# Patient Record
Sex: Male | Born: 1955 | Race: Black or African American | Hispanic: No | Marital: Single | Smoking: Never smoker
Health system: Southern US, Community
[De-identification: ages and names within clinical notes are randomized; demographics above are authoritative.]

## PROBLEM LIST (undated history)

## (undated) DIAGNOSIS — E785 Hyperlipidemia, unspecified: Secondary | ICD-10-CM

## (undated) DIAGNOSIS — S069XAA Unspecified intracranial injury with loss of consciousness status unknown, initial encounter: Secondary | ICD-10-CM

## (undated) DIAGNOSIS — U071 COVID-19: Secondary | ICD-10-CM

## (undated) DIAGNOSIS — S069X9A Unspecified intracranial injury with loss of consciousness of unspecified duration, initial encounter: Secondary | ICD-10-CM

---

## 1999-09-17 ENCOUNTER — Encounter: Payer: Self-pay | Admitting: Family Medicine

## 1999-09-17 ENCOUNTER — Encounter: Admission: RE | Admit: 1999-09-17 | Discharge: 1999-09-17 | Payer: Self-pay | Admitting: Family Medicine

## 2000-03-08 ENCOUNTER — Encounter: Payer: Self-pay | Admitting: Emergency Medicine

## 2000-03-08 ENCOUNTER — Inpatient Hospital Stay (HOSPITAL_COMMUNITY): Admission: EM | Admit: 2000-03-08 | Discharge: 2000-03-10 | Payer: Self-pay | Admitting: Emergency Medicine

## 2011-03-29 DIAGNOSIS — R635 Abnormal weight gain: Secondary | ICD-10-CM | POA: Diagnosis not present

## 2011-03-29 DIAGNOSIS — Z125 Encounter for screening for malignant neoplasm of prostate: Secondary | ICD-10-CM | POA: Diagnosis not present

## 2012-09-11 DIAGNOSIS — Z125 Encounter for screening for malignant neoplasm of prostate: Secondary | ICD-10-CM | POA: Diagnosis not present

## 2012-09-11 DIAGNOSIS — E785 Hyperlipidemia, unspecified: Secondary | ICD-10-CM | POA: Diagnosis not present

## 2012-09-11 DIAGNOSIS — G931 Anoxic brain damage, not elsewhere classified: Secondary | ICD-10-CM | POA: Diagnosis not present

## 2012-09-11 DIAGNOSIS — Z1211 Encounter for screening for malignant neoplasm of colon: Secondary | ICD-10-CM | POA: Diagnosis not present

## 2013-04-05 DIAGNOSIS — E785 Hyperlipidemia, unspecified: Secondary | ICD-10-CM | POA: Diagnosis not present

## 2013-07-03 DIAGNOSIS — E785 Hyperlipidemia, unspecified: Secondary | ICD-10-CM | POA: Diagnosis not present

## 2013-08-19 DIAGNOSIS — Z79899 Other long term (current) drug therapy: Secondary | ICD-10-CM | POA: Diagnosis not present

## 2013-08-19 DIAGNOSIS — E785 Hyperlipidemia, unspecified: Secondary | ICD-10-CM | POA: Diagnosis not present

## 2013-10-03 DIAGNOSIS — E785 Hyperlipidemia, unspecified: Secondary | ICD-10-CM | POA: Diagnosis not present

## 2013-10-03 DIAGNOSIS — Z79899 Other long term (current) drug therapy: Secondary | ICD-10-CM | POA: Diagnosis not present

## 2014-03-20 ENCOUNTER — Ambulatory Visit (HOSPITAL_COMMUNITY)
Admission: RE | Admit: 2014-03-20 | Discharge: 2014-03-20 | Disposition: A | Discharge status: Home / Self Care | Payer: Medicare Other | Source: Ambulatory Visit | Attending: Family Medicine | Admitting: Family Medicine

## 2014-03-20 ENCOUNTER — Other Ambulatory Visit (HOSPITAL_COMMUNITY): Payer: Self-pay | Admitting: Family Medicine

## 2014-03-20 DIAGNOSIS — M7989 Other specified soft tissue disorders: Secondary | ICD-10-CM

## 2014-03-20 DIAGNOSIS — M79609 Pain in unspecified limb: Secondary | ICD-10-CM | POA: Diagnosis not present

## 2014-03-20 DIAGNOSIS — Z23 Encounter for immunization: Secondary | ICD-10-CM | POA: Diagnosis not present

## 2014-03-20 NOTE — Progress Notes (Signed)
VASCULAR LAB PRELIMINARY  PRELIMINARY  PRELIMINARY  PRELIMINARY  Right lower extremity venous duplex completed.    Preliminary report:  Right:  No evidence of DVT, superficial thrombosis, or Baker's cyst.  Yavonne Kiss, RVT 03/20/2014, 5:20 PM

## 2014-12-11 DIAGNOSIS — Z23 Encounter for immunization: Secondary | ICD-10-CM | POA: Diagnosis not present

## 2015-10-01 DIAGNOSIS — G931 Anoxic brain damage, not elsewhere classified: Secondary | ICD-10-CM | POA: Diagnosis not present

## 2015-10-01 DIAGNOSIS — Z0000 Encounter for general adult medical examination without abnormal findings: Secondary | ICD-10-CM | POA: Diagnosis not present

## 2015-10-01 DIAGNOSIS — E785 Hyperlipidemia, unspecified: Secondary | ICD-10-CM | POA: Diagnosis not present

## 2015-10-01 DIAGNOSIS — Z1211 Encounter for screening for malignant neoplasm of colon: Secondary | ICD-10-CM | POA: Diagnosis not present

## 2015-10-01 DIAGNOSIS — Z125 Encounter for screening for malignant neoplasm of prostate: Secondary | ICD-10-CM | POA: Diagnosis not present

## 2015-10-21 DIAGNOSIS — Z1211 Encounter for screening for malignant neoplasm of colon: Secondary | ICD-10-CM | POA: Diagnosis not present

## 2016-01-01 DIAGNOSIS — E785 Hyperlipidemia, unspecified: Secondary | ICD-10-CM | POA: Diagnosis not present

## 2016-04-06 DIAGNOSIS — K625 Hemorrhage of anus and rectum: Secondary | ICD-10-CM | POA: Diagnosis not present

## 2017-02-24 DIAGNOSIS — E785 Hyperlipidemia, unspecified: Secondary | ICD-10-CM | POA: Diagnosis not present

## 2017-02-24 DIAGNOSIS — Z125 Encounter for screening for malignant neoplasm of prostate: Secondary | ICD-10-CM | POA: Diagnosis not present

## 2017-06-19 DIAGNOSIS — E785 Hyperlipidemia, unspecified: Secondary | ICD-10-CM | POA: Diagnosis not present

## 2017-06-19 DIAGNOSIS — Z79899 Other long term (current) drug therapy: Secondary | ICD-10-CM | POA: Diagnosis not present

## 2017-06-19 DIAGNOSIS — Z23 Encounter for immunization: Secondary | ICD-10-CM | POA: Diagnosis not present

## 2017-06-19 DIAGNOSIS — Z1211 Encounter for screening for malignant neoplasm of colon: Secondary | ICD-10-CM | POA: Diagnosis not present

## 2017-06-19 DIAGNOSIS — Z0000 Encounter for general adult medical examination without abnormal findings: Secondary | ICD-10-CM | POA: Diagnosis not present

## 2017-06-19 DIAGNOSIS — G931 Anoxic brain damage, not elsewhere classified: Secondary | ICD-10-CM | POA: Diagnosis not present

## 2018-12-12 DIAGNOSIS — Z125 Encounter for screening for malignant neoplasm of prostate: Secondary | ICD-10-CM | POA: Diagnosis not present

## 2018-12-12 DIAGNOSIS — Z23 Encounter for immunization: Secondary | ICD-10-CM | POA: Diagnosis not present

## 2018-12-12 DIAGNOSIS — Z0000 Encounter for general adult medical examination without abnormal findings: Secondary | ICD-10-CM | POA: Diagnosis not present

## 2018-12-12 DIAGNOSIS — G931 Anoxic brain damage, not elsewhere classified: Secondary | ICD-10-CM | POA: Diagnosis not present

## 2018-12-12 DIAGNOSIS — Z1211 Encounter for screening for malignant neoplasm of colon: Secondary | ICD-10-CM | POA: Diagnosis not present

## 2018-12-12 DIAGNOSIS — E785 Hyperlipidemia, unspecified: Secondary | ICD-10-CM | POA: Diagnosis not present

## 2019-01-14 DIAGNOSIS — U071 COVID-19: Secondary | ICD-10-CM | POA: Diagnosis not present

## 2019-01-27 ENCOUNTER — Other Ambulatory Visit: Payer: Self-pay

## 2019-01-27 ENCOUNTER — Emergency Department (HOSPITAL_COMMUNITY): Payer: Medicare HMO

## 2019-01-27 ENCOUNTER — Inpatient Hospital Stay (HOSPITAL_COMMUNITY)
Admission: EM | Admit: 2019-01-27 | Discharge: 2019-01-30 | DRG: 872 | Disposition: A | Discharge status: Home / Self Care | Payer: Medicare HMO | Attending: Internal Medicine | Admitting: Internal Medicine

## 2019-01-27 DIAGNOSIS — R748 Abnormal levels of other serum enzymes: Secondary | ICD-10-CM | POA: Diagnosis not present

## 2019-01-27 DIAGNOSIS — N179 Acute kidney failure, unspecified: Secondary | ICD-10-CM | POA: Diagnosis present

## 2019-01-27 DIAGNOSIS — E876 Hypokalemia: Secondary | ICD-10-CM | POA: Diagnosis not present

## 2019-01-27 DIAGNOSIS — Z806 Family history of leukemia: Secondary | ICD-10-CM | POA: Diagnosis not present

## 2019-01-27 DIAGNOSIS — Z8619 Personal history of other infectious and parasitic diseases: Secondary | ICD-10-CM | POA: Diagnosis not present

## 2019-01-27 DIAGNOSIS — A419 Sepsis, unspecified organism: Secondary | ICD-10-CM | POA: Diagnosis present

## 2019-01-27 DIAGNOSIS — Z79899 Other long term (current) drug therapy: Secondary | ICD-10-CM | POA: Diagnosis not present

## 2019-01-27 DIAGNOSIS — N3001 Acute cystitis with hematuria: Secondary | ICD-10-CM | POA: Diagnosis not present

## 2019-01-27 DIAGNOSIS — Z791 Long term (current) use of non-steroidal anti-inflammatories (NSAID): Secondary | ICD-10-CM

## 2019-01-27 DIAGNOSIS — R17 Unspecified jaundice: Secondary | ICD-10-CM | POA: Diagnosis not present

## 2019-01-27 DIAGNOSIS — N400 Enlarged prostate without lower urinary tract symptoms: Secondary | ICD-10-CM | POA: Diagnosis present

## 2019-01-27 DIAGNOSIS — R310 Gross hematuria: Secondary | ICD-10-CM | POA: Diagnosis present

## 2019-01-27 DIAGNOSIS — D62 Acute posthemorrhagic anemia: Secondary | ICD-10-CM | POA: Diagnosis not present

## 2019-01-27 DIAGNOSIS — R404 Transient alteration of awareness: Secondary | ICD-10-CM | POA: Diagnosis not present

## 2019-01-27 DIAGNOSIS — N390 Urinary tract infection, site not specified: Secondary | ICD-10-CM | POA: Diagnosis not present

## 2019-01-27 DIAGNOSIS — I341 Nonrheumatic mitral (valve) prolapse: Secondary | ICD-10-CM | POA: Diagnosis not present

## 2019-01-27 DIAGNOSIS — I700 Atherosclerosis of aorta: Secondary | ICD-10-CM | POA: Diagnosis not present

## 2019-01-27 DIAGNOSIS — U071 COVID-19: Secondary | ICD-10-CM | POA: Diagnosis present

## 2019-01-27 DIAGNOSIS — R410 Disorientation, unspecified: Secondary | ICD-10-CM | POA: Diagnosis not present

## 2019-01-27 DIAGNOSIS — E785 Hyperlipidemia, unspecified: Secondary | ICD-10-CM | POA: Diagnosis present

## 2019-01-27 DIAGNOSIS — R7989 Other specified abnormal findings of blood chemistry: Secondary | ICD-10-CM | POA: Diagnosis not present

## 2019-01-27 DIAGNOSIS — Z87820 Personal history of traumatic brain injury: Secondary | ICD-10-CM | POA: Diagnosis not present

## 2019-01-27 DIAGNOSIS — R000 Tachycardia, unspecified: Secondary | ICD-10-CM | POA: Diagnosis not present

## 2019-01-27 DIAGNOSIS — N3289 Other specified disorders of bladder: Secondary | ICD-10-CM | POA: Diagnosis not present

## 2019-01-27 HISTORY — DX: Unspecified intracranial injury with loss of consciousness of unspecified duration, initial encounter: S06.9X9A

## 2019-01-27 HISTORY — DX: Hyperlipidemia, unspecified: E78.5

## 2019-01-27 HISTORY — DX: COVID-19: U07.1

## 2019-01-27 HISTORY — DX: Unspecified intracranial injury with loss of consciousness status unknown, initial encounter: S06.9XAA

## 2019-01-27 LAB — HEPATIC FUNCTION PANEL
ALT: 23 U/L (ref 0–44)
AST: 21 U/L (ref 15–41)
Albumin: 3.1 g/dL — ABNORMAL LOW (ref 3.5–5.0)
Alkaline Phosphatase: 106 U/L (ref 38–126)
Bilirubin, Direct: 0.5 mg/dL — ABNORMAL HIGH (ref 0.0–0.2)
Indirect Bilirubin: 2.4 mg/dL — ABNORMAL HIGH (ref 0.3–0.9)
Total Bilirubin: 2.9 mg/dL — ABNORMAL HIGH (ref 0.3–1.2)
Total Protein: 8 g/dL (ref 6.5–8.1)

## 2019-01-27 LAB — URINALYSIS, ROUTINE W REFLEX MICROSCOPIC
Bilirubin Urine: NEGATIVE
Glucose, UA: NEGATIVE mg/dL
Ketones, ur: 20 mg/dL — AB
Nitrite: NEGATIVE
Protein, ur: 100 mg/dL — AB
Specific Gravity, Urine: >1.046 — ABNORMAL HIGH (ref 1.005–1.030)
pH: 5 (ref 5.0–8.0)

## 2019-01-27 LAB — CBC
HCT: 43 % (ref 39.0–52.0)
Hemoglobin: 14 g/dL (ref 13.0–17.0)
MCH: 29.2 pg (ref 26.0–34.0)
MCHC: 32.6 g/dL (ref 30.0–36.0)
MCV: 89.6 fL (ref 80.0–100.0)
Platelets: 285 10*3/uL (ref 150–400)
RBC: 4.8 MIL/uL (ref 4.22–5.81)
RDW: 13.4 % (ref 11.5–15.5)
WBC: 19.8 10*3/uL — ABNORMAL HIGH (ref 4.0–10.5)
nRBC: 0 % (ref 0.0–0.2)

## 2019-01-27 LAB — LACTIC ACID, PLASMA
Lactic Acid, Venous: 4 mmol/L (ref 0.5–1.9)
Lactic Acid, Venous: 1.8 mmol/L (ref 0.5–1.9)

## 2019-01-27 LAB — BASIC METABOLIC PANEL
Anion gap: 13 (ref 5–15)
BUN: 15 mg/dL (ref 8–23)
CO2: 27 mmol/L (ref 22–32)
Calcium: 9.7 mg/dL (ref 8.9–10.3)
Chloride: 98 mmol/L (ref 98–111)
Creatinine, Ser: 1.25 mg/dL — ABNORMAL HIGH (ref 0.61–1.24)
GFR calc Af Amer: >60 mL/min (ref >60)
GFR calc non Af Amer: >60 mL/min (ref >60)
Glucose, Bld: 119 mg/dL — ABNORMAL HIGH (ref 70–99)
Potassium: 3.9 mmol/L (ref 3.5–5.1)
Sodium: 138 mmol/L (ref 135–145)

## 2019-01-27 LAB — TROPONIN I (HIGH SENSITIVITY): Troponin I (High Sensitivity): 48 ng/L — ABNORMAL HIGH (ref <18)

## 2019-01-27 LAB — POC OCCULT BLOOD, ED: Fecal Occult Bld: NEGATIVE

## 2019-01-27 LAB — APTT: aPTT: 24 seconds (ref 24–36)

## 2019-01-27 LAB — PROTIME-INR
INR: 1.2 (ref 0.8–1.2)
Prothrombin Time: 15.2 seconds (ref 11.4–15.2)

## 2019-01-27 MED ORDER — SODIUM CHLORIDE 0.9 % IV SOLN
2.0000 g | Freq: Three times a day (TID) | INTRAVENOUS | Status: DC
  Administered 2019-01-28 – 2019-01-29 (×5): 2 g via INTRAVENOUS
  Filled 2019-01-27 (×7): qty 2

## 2019-01-27 MED ORDER — ATORVASTATIN CALCIUM 40 MG PO TABS
40.0000 mg | ORAL_TABLET | Freq: Every day | ORAL | Status: DC
  Administered 2019-01-28 – 2019-01-29 (×2): 40 mg via ORAL
  Filled 2019-01-27 (×2): qty 1

## 2019-01-27 MED ORDER — SODIUM CHLORIDE 0.9 % IV SOLN
2.0000 g | Freq: Once | INTRAVENOUS | Status: AC
  Administered 2019-01-27: 2 g via INTRAVENOUS
  Filled 2019-01-27: qty 2

## 2019-01-27 MED ORDER — SODIUM CHLORIDE 0.9 % IV BOLUS (SEPSIS)
1000.0000 mL | Freq: Once | INTRAVENOUS | Status: AC
  Administered 2019-01-27: 1000 mL via INTRAVENOUS

## 2019-01-27 MED ORDER — IOHEXOL 300 MG/ML  SOLN
100.0000 mL | Freq: Once | INTRAMUSCULAR | Status: AC | PRN
  Administered 2019-01-27: 100 mL via INTRAVENOUS

## 2019-01-27 MED ORDER — ACETAMINOPHEN 325 MG PO TABS
650.0000 mg | ORAL_TABLET | Freq: Four times a day (QID) | ORAL | Status: DC | PRN
  Administered 2019-01-27 – 2019-01-28 (×2): 650 mg via ORAL
  Filled 2019-01-27 (×3): qty 2

## 2019-01-27 MED ORDER — METRONIDAZOLE IN NACL 5-0.79 MG/ML-% IV SOLN
500.0000 mg | Freq: Once | INTRAVENOUS | Status: AC
  Administered 2019-01-27: 21:00:00 500 mg via INTRAVENOUS
  Filled 2019-01-27: qty 100

## 2019-01-27 MED ORDER — VANCOMYCIN HCL 10 G IV SOLR
1750.0000 mg | Freq: Once | INTRAVENOUS | Status: DC
  Filled 2019-01-27: qty 1750

## 2019-01-27 MED ORDER — SODIUM CHLORIDE 0.9 % IV BOLUS (SEPSIS)
250.0000 mL | Freq: Once | INTRAVENOUS | Status: AC
  Administered 2019-01-27: 250 mL via INTRAVENOUS

## 2019-01-27 MED ORDER — ACETAMINOPHEN 650 MG RE SUPP: 650.0000 mg | Freq: Four times a day (QID) | RECTAL | Status: DC | PRN

## 2019-01-27 MED ORDER — VANCOMYCIN HCL 10 G IV SOLR
2000.0000 mg | Freq: Once | INTRAVENOUS | Status: DC
  Administered 2019-01-27: 22:00:00 2000 mg via INTRAVENOUS
  Filled 2019-01-27: qty 2000

## 2019-01-27 MED ORDER — VANCOMYCIN HCL 10 G IV SOLR
1500.0000 mg | INTRAVENOUS | Status: DC
  Administered 2019-01-28: 1500 mg via INTRAVENOUS
  Filled 2019-01-27 (×2): qty 1500

## 2019-01-27 MED ORDER — VANCOMYCIN HCL IN DEXTROSE 1-5 GM/200ML-% IV SOLN: 1000.0000 mg | Freq: Once | INTRAVENOUS | Status: DC

## 2019-01-27 NOTE — ED Notes (Signed)
Pt to CT

## 2019-01-27 NOTE — ED Notes (Signed)
Attempted to straight cath patient, patient was unable to cooperate. No urine return with straight cath

## 2019-01-27 NOTE — ED Triage Notes (Signed)
Pt bib ems from home after wife noticed blood in brief. Hx of same, today was more than normal. Hx TBI. Pt not able to provide hx. Tested +covid approx 2 weeks ago. Vitals hard to obtain.  100F HR 120  136/80 97% RA

## 2019-01-27 NOTE — ED Notes (Signed)
Bruce Gonzales [phone] relative

## 2019-01-27 NOTE — Progress Notes (Signed)
Pharmacy Antibiotic Note  Bruce Gonzales is a 63 y.o. male admitted on 01/27/2019 with sepsis.  Pharmacy has been consulted for Cefepime and Vancomycin dosing.  Weight: 162 lb 11.2 oz (73.8 kg)  Temp (24hrs), Avg:99.8 F (37.7 C), Min:98.5 F (36.9 C), Max:101 F (38.3 C)  Recent Labs  Lab 01/27/19 1544 01/27/19 1904  WBC 19.8*  --   CREATININE 1.25*  --   LATICACIDVEN  --  4.0*    CrCl cannot be calculated (Unknown ideal weight.).    No Known Allergies  Antimicrobials this admission: 11/29 Cefepime >>  11/29 Flagyl >>  11/29 Vancomycin >>   Dose adjustments this admission: N/a  Microbiology results: 11/29 BCx: Pending  11/29 UCx: Pending   Plan: - Cefepime 2 grams IV q8h  - Vancomycin 1750 mg IV x 1 dose  - Followed by Vancomycin 1500 mg IV q24h  - Est calc AUC 508 - Monitor patients renal function and opportunity to deescalate abx when appropriate   Thank you for allowing pharmacy to be a part of this patient's care.  Duanne Limerick PharmD. BCPS  01/27/2019 8:33 PM
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
## 2019-01-27 NOTE — H&P (Addendum)
TRH H&P    Patient Demographics:    Bruce Gonzales, is a 63 y.o. male  MRN: [id number]  DOB - 1955-12-15  Admit Date - 01/27/2019  Referring MD/NP/PA:  Benedetto Goad  Outpatient Primary MD for the patient is Harlan Stains, MD  Patient coming from:  home  Chief complaint-   hematuria   HPI:    Bruce Gonzales  is a 63 y.o. male,  w TBI , Covid 19 positive about 2 weeks ago, presents w blood in diaper.  Family denies fever, chills, cough, cp, palp, sob, n/v, diarrhea, brbpr.  Family is not sure if having dysuria.  Pt was sent for gross hematuria.    In ED,  T 98.5, P 100 R 20, Bp 130/89,  poox 96% on RA  Wt 73.8kg T 101  Wbc 19.8, Hgb 14.0, Plt 285 Na 138, K 3.9 Bun 15, Creatinine 1.25 Ast 21, Alt 23, Alk phos 106, T. Bili 2.9 Alb 3.1  Lactic acid 4.0-> 1.8 Blood culture x2   CXR IMPRESSION: No active disease.  CT abd/ pelvis IMPRESSION: Thickened bladder wall. This could be related to bladder outlet obstruction with the enlarged prostate. This could also represent cystitis. Recommend clinical correlation.  Urinalysis wbc 11-20, rbc 11-20  Pt will be admitted for sepsis (fever, elevated wbc, elevated lactic acid),  Secondary to UTI.     Review of systems:    In addition to the HPI above, pt is unable to provide history No Fever-chills, No Headache, No changes with Vision or hearing, No problems swallowing food or Liquids, No Chest pain, Cough or Shortness of Breath, No Abdominal pain, No Nausea or Vomiting, bowel movements are regular, No Blood in stool or Urine, No dysuria, No new skin rashes or bruises, No new joints pains-aches,  No new weakness, tingling, numbness in any extremity, No recent weight gain or loss, No polyuria, polydypsia or polyphagia, No significant Mental Stressors.  All other systems reviewed and are negative.    Past History of the following :    Past  Medical History:  Diagnosis Date   COVID-19    Hyperlipidemia    TBI (traumatic brain injury) ([place])       History reviewed. No pertinent surgical history. None per sister   Social History:      Social History   Tobacco Use   Smoking status: Never Smoker   Smokeless tobacco: Never Used  Substance Use Topics   Alcohol use: Not Currently       Family History :     Family History  Problem Relation Age of Onset   Leukemia Mother       Home Medications:   Prior to Admission medications   Medication Sig Start Date End Date Taking? Authorizing Provider  atorvastatin (LIPITOR) 40 MG tablet Take 40 mg by mouth at bedtime.  09/26/18  Yes [provider]  Cholecalciferol (VITAMIN D-3) 125 MCG (5000 UT) TABS Take 5,000 Units by mouth daily.   Yes [provider]  Multiple Vitamin (MULTIVITAMIN WITH MINERALS) TABS  tablet Take 1 tablet by mouth daily.   Yes [provider]  naproxen sodium (ALEVE) 220 MG tablet Take 440 mg by mouth 2 (two) times daily as needed (pain).   Yes [provider]     Allergies:    No Known Allergies   Physical Exam:   Vitals  Blood pressure 126/84, pulse (!) 119, temperature (!) 100.4 F (38 C), temperature source Oral, resp. rate 18, weight 73.8 kg, SpO2 97 %.  1.  General: axoxo1  2. Psychiatric: euthymic  3. Neurologic: nonfocal  4. HEENMT:  Anicteric, pupils 1.15m symmetric, direct, consensual, near intact Neck: no jvd  5. Respiratory : CTAB  6. Cardiovascular : rrr s1, s2,   7. Gastrointestinal:  Abd: soft, nt, nd, +bs  8. Skin:  Ext: no c/c/e, no rash  9.Musculoskeletal:  Good ROM    Data Review:    CBC Recent Labs  Lab 01/27/19 1544  WBC 19.8*  HGB 14.0  HCT 43.0  PLT 285  MCV 89.6  MCH 29.2  MCHC 32.6  RDW 13.4   ------------------------------------------------------------------------------------------------------------------  Results for orders placed or  performed during the hospital encounter of 01/27/19 (from the past 48 hour(s))  Basic metabolic panel     Status: Abnormal   Collection Time: 01/27/19  3:44 PM  Result Value Ref Range   Sodium 138 135 - 145 mmol/L   Potassium 3.9 3.5 - 5.1 mmol/L   Chloride 98 98 - 111 mmol/L   CO2 27 22 - 32 mmol/L   Glucose, Bld 119 (H) 70 - 99 mg/dL   BUN 15 8 - 23 mg/dL   Creatinine, Ser 1.25 (H) 0.61 - 1.24 mg/dL   Calcium 9.7 8.9 - 10.3 mg/dL   GFR calc non Af Amer >60 >60 mL/min   GFR calc Af Amer >60 >60 mL/min   Anion gap 13 5 - 15    Comment: Performed at [facility] [address], [id number] CBC     Status: Abnormal   Collection Time: 01/27/19  3:44 PM  Result Value Ref Range   WBC 19.8 (H) 4.0 - 10.5 K/uL   RBC 4.80 4.22 - 5.81 MIL/uL   Hemoglobin 14.0 13.0 - 17.0 g/dL   HCT 43.0 39.0 - 52.0 %   MCV 89.6 80.0 - 100.0 fL   MCH 29.2 26.0 - 34.0 pg   MCHC 32.6 30.0 - 36.0 g/dL   RDW 13.4 11.5 - 15.5 %   Platelets 285 150 - 400 K/uL   nRBC 0.0 0.0 - 0.2 %    Comment: Performed at [facility] [address], [place] [address] POC occult blood, ED     Status: None   Collection Time: 01/27/19  5:56 PM  Result Value Ref Range   Fecal Occult Bld NEGATIVE NEGATIVE  Lactic acid, plasma     Status: Abnormal   Collection Time: 01/27/19  7:04 PM  Result Value Ref Range   Lactic Acid, Venous 4.0 (HH) 0.5 - 1.9 mmol/L    Comment: CRITICAL RESULT CALLED TO, READ BACK BY AND VERIFIED WITH: RN A WILSON AT 1936 01/27/2019 BY L BENFIELD Performed at [facility] [address] [place] [address]  APTT     Status: None   Collection Time: 01/27/19  7:04 PM  Result Value Ref Range   aPTT 24 24 - 36 seconds    Comment: Performed at [facility], [address], [place], [place] [address]  Protime-INR     Status: None   Collection Time: 01/27/19  7:04 PM  Result Value Ref Range   Prothrombin Time 15.2 11.4 - 15.2 seconds   INR 1.2  0.8 - 1.2    Comment: (NOTE) INR goal varies based on device and disease states. Performed at [facility], [place] [address] [place], [place] [address]   Hepatic function panel     Status: Abnormal   Collection Time: 01/27/19  7:04 PM  Result Value Ref Range   Total Protein 8.0 6.5 - 8.1 g/dL   Albumin 3.1 (L) 3.5 - 5.0 g/dL   AST 21 15 - 41 U/L   ALT 23 0 - 44 U/L   Alkaline Phosphatase 106 38 - 126 U/L   Total Bilirubin 2.9 (H) 0.3 - 1.2 mg/dL   Bilirubin, Direct 0.5 (H) 0.0 - 0.2 mg/dL   Indirect Bilirubin 2.4 (H) 0.3 - 0.9 mg/dL    Comment: Performed at [facility] [address], [place], [place] [address]  Urinalysis, Routine w reflex microscopic- may I&O cath if menses     Status: Abnormal   Collection Time: 01/27/19  8:35 PM  Result Value Ref Range   Color, Urine AMBER (A) YELLOW    Comment: BIOCHEMICALS MAY BE AFFECTED BY COLOR   APPearance CLOUDY (A) CLEAR   Specific Gravity, Urine >1.046 (H) 1.005 - 1.030   pH 5.0 5.0 - 8.0   Glucose, UA NEGATIVE NEGATIVE mg/dL   Hgb urine dipstick LARGE (A) NEGATIVE   Bilirubin Urine NEGATIVE NEGATIVE   Ketones, ur 20 (A) NEGATIVE mg/dL   Protein, ur 100 (A) NEGATIVE mg/dL   Nitrite NEGATIVE NEGATIVE   Leukocytes,Ua SMALL (A) NEGATIVE   RBC / HPF 11-20 0 - 5 RBC/hpf   WBC, UA 11-20 0 - 5 WBC/hpf   Bacteria, UA FEW (A) NONE SEEN   WBC Clumps PRESENT    Mucus PRESENT    Amorphous Crystal PRESENT     Comment: Performed at [facility], [address] [place], [place] [address]  Lactic acid, plasma     Status: None   Collection Time: 01/27/19 10:35 PM  Result Value Ref Range   Lactic Acid, Venous 1.8 0.5 - 1.9 mmol/L    Comment: Performed at [facility] [address], [place], [place] [address]  Troponin I (High Sensitivity)     Status: Abnormal   Collection Time: 01/27/19 10:35 PM  Result Value Ref Range   Troponin I (High Sensitivity) 48 (H) <18 ng/L    Comment: (NOTE) Elevated high sensitivity  troponin I (hsTnI) values and significant  changes across serial measurements may suggest ACS but many other  chronic and acute conditions are known to elevate hsTnI results.  Refer to the "Links" section for chest pain algorithms and additional  guidance. Performed at [facility], [place] [address], [place],  [address]     Chemistries  Recent Labs  Lab 01/27/19 1544 01/27/19 1904  NA 138  --   K 3.9  --   CL 98  --   CO2 27  --   GLUCOSE 119*  --   BUN 15  --   CREATININE 1.25*  --   CALCIUM 9.7  --   AST  --  21  ALT  --  23  ALKPHOS  --  106  BILITOT  --  2.9*   ------------------------------------------------------------------------------------------------------------------  ------------------------------------------------------------------------------------------------------------------  GFR: CrCl cannot be calculated (Unknown ideal weight.). Liver Function Tests: Recent Labs  Lab 01/27/19 1904  AST 21  ALT 23  ALKPHOS 106  BILITOT 2.9*  PROT 8.0  ALBUMIN 3.1*   No results for input(s): LIPASE, AMYLASE in the last 168 hours. No results for input(s): AMMONIA in the last 168 hours. Coagulation Profile: Recent Labs  Lab 01/27/19 1904  INR 1.2   Cardiac Enzymes: No results for input(s): CKTOTAL, CKMB, CKMBINDEX, TROPONINI in the last 168 hours. BNP (last 3 results) No results for input(s): PROBNP in the last 8760 hours. HbA1C: No results for input(s): HGBA1C in the last 72 hours. CBG: No results for input(s): GLUCAP in the last 168 hours. Lipid Profile: No results for input(s): CHOL, HDL, LDLCALC, TRIG, CHOLHDL, LDLDIRECT in the last 72 hours. Thyroid Function Tests: No results for input(s): TSH, T4TOTAL, FREET4, T3FREE, THYROIDAB in the last 72 hours. Anemia Panel: No results for input(s): VITAMINB12, FOLATE, FERRITIN, TIBC, IRON, RETICCTPCT in the last 72  hours.  --------------------------------------------------------------------------------------------------------------- Urine analysis:    Component Value Date/Time   COLORURINE AMBER (A) 01/27/2019 2035   APPEARANCEUR CLOUDY (A) 01/27/2019 2035   LABSPEC >1.046 (H) 01/27/2019 2035   PHURINE 5.0 01/27/2019 2035   GLUCOSEU NEGATIVE 01/27/2019 2035   HGBUR LARGE (A) 01/27/2019 2035   BILIRUBINUR NEGATIVE 01/27/2019 2035   KETONESUR 20 (A) 01/27/2019 2035   PROTEINUR 100 (A) 01/27/2019 2035   NITRITE NEGATIVE 01/27/2019 2035   LEUKOCYTESUR SMALL (A) 01/27/2019 2035      Imaging Results:    Ct Abdomen Pelvis W Contrast  Result Date: 01/27/2019 CLINICAL DATA:  Abdominal distention. EXAM: CT ABDOMEN AND PELVIS WITH CONTRAST TECHNIQUE: Multidetector CT imaging of the abdomen and pelvis was performed using the standard protocol following bolus administration of intravenous contrast. CONTRAST:  148m OMNIPAQUE IOHEXOL 300 MG/ML  SOLN COMPARISON:  None. FINDINGS: Lower chest: Lung bases are clear. No effusions. Heart is normal size. Hepatobiliary: No focal hepatic abnormality. Gallbladder unremarkable. Pancreas: No focal abnormality or ductal dilatation. Spleen: No focal abnormality.  Normal size. Adrenals/Urinary Tract: No hydronephrosis. No renal or adrenal mass. Urinary bladder appears thick walled, decompressed. Stomach/Bowel: Normal appendix. Stomach, large and small bowel grossly unremarkable. Vascular/Lymphatic: Aortic atherosclerosis. No enlarged abdominal or pelvic lymph nodes. Reproductive: Prostate enlargement. Other: No free fluid or free air. Musculoskeletal: No acute bony abnormality. IMPRESSION: Thickened bladder wall. This could be related to bladder outlet obstruction with the enlarged prostate. This could also represent cystitis. Recommend clinical correlation. Aortic atherosclerosis. Electronically Signed   By: KRolm BaptiseM.D.   On: 01/27/2019 20:42   Dg Chest Port 1  View  Result Date: 01/27/2019 CLINICAL DATA:  Patient diagnosed with COVID-19 2 weeks ago. Confusion. EXAM: PORTABLE CHEST 1 VIEW COMPARISON:  None. FINDINGS: The heart size and mediastinal contours are within normal limits. Both lungs are clear. The visualized skeletal structures are unremarkable. IMPRESSION: No active disease. Electronically Signed   By: DDorise BullionIII M.D   On: 01/27/2019 18:10    ekg st at 125, nl axis, q in v1-3, ? St elevation in v1-3   Assessment & Plan:    Principal Problem:   Sepsis ([place] Active Problems:   Acute lower UTI  Sepsis secondary to UTI Blood culture x2 Urine culture Vanco iv, cefepime iv pharmacy to dose  Acute lower uti abx as above  Gross hematuria Check psa Please consult urology or curbside them in am  Covid -19 infection recently.  (2 weeks ago,  tested at health [facility]) It's unclear if he is symptomatic  Check covid-19 pcr PUI for now, please follow up on result  Tachycardia, elevated troponin (? Demand ischemia) Check cpk ,mb, trop I Check Tsh Check  D dimer, if positive then either CTA chest or VQ Check cardiac echo  Consider cardiology consultation   DVT Prophylaxis-   SCDs  AM Labs Ordered, also please review Full Orders  Family Communication: Admission, patients condition and plan of care including tests being ordered have been discussed with the patient who indicate understanding and agree with the plan and Code Status.  Code Status:  FULL CODE per relative April McCoy  Admission status: npatient: Based on patients clinical presentation and evaluation of above clinical data, I have made determination that patient meets Inpatient criteria at this time.  Pt meets sepsis criteria and will require iv fluids and iv abx,  And has high risk of clinical deterioration.  Pt will also need w/up of gross hematuria.  Pt will require >2 nites stay.   Time spent in minutes : 70    Jani Gravel M.D on 01/28/2019 at 12:14  AM

## 2019-01-27 NOTE — ED Notes (Signed)
Bladder scan 34ML

## 2019-01-27 NOTE — ED Notes (Signed)
Bruce Gonzales [phone] Sister in [place] who helps with caretaking

## 2019-01-28 ENCOUNTER — Inpatient Hospital Stay (HOSPITAL_COMMUNITY): Payer: Medicare HMO

## 2019-01-28 ENCOUNTER — Other Ambulatory Visit (HOSPITAL_COMMUNITY): Payer: Medicare HMO

## 2019-01-28 ENCOUNTER — Encounter (HOSPITAL_COMMUNITY): Payer: Self-pay | Admitting: Internal Medicine

## 2019-01-28 DIAGNOSIS — U071 COVID-19: Secondary | ICD-10-CM

## 2019-01-28 LAB — COMPREHENSIVE METABOLIC PANEL
ALT: 22 U/L (ref 0–44)
AST: 25 U/L (ref 15–41)
Albumin: 2.6 g/dL — ABNORMAL LOW (ref 3.5–5.0)
Alkaline Phosphatase: 101 U/L (ref 38–126)
Anion gap: 11 (ref 5–15)
BUN: 14 mg/dL (ref 8–23)
CO2: 23 mmol/L (ref 22–32)
Calcium: 8.6 mg/dL — ABNORMAL LOW (ref 8.9–10.3)
Chloride: 105 mmol/L (ref 98–111)
Creatinine, Ser: 1.19 mg/dL (ref 0.61–1.24)
GFR calc Af Amer: >60 mL/min (ref >60)
GFR calc non Af Amer: >60 mL/min (ref >60)
Glucose, Bld: 99 mg/dL (ref 70–99)
Potassium: 3.7 mmol/L (ref 3.5–5.1)
Sodium: 139 mmol/L (ref 135–145)
Total Bilirubin: 3 mg/dL — ABNORMAL HIGH (ref 0.3–1.2)
Total Protein: 6.8 g/dL (ref 6.5–8.1)

## 2019-01-28 LAB — CK TOTAL AND CKMB (NOT AT ARMC)
CK, MB: 3.7 ng/mL (ref 0.5–5.0)
Relative Index: 1.3 (ref 0.0–2.5)
Total CK: 296 U/L (ref 49–397)

## 2019-01-28 LAB — CBC
HCT: 37.8 % — ABNORMAL LOW (ref 39.0–52.0)
Hemoglobin: 12.1 g/dL — ABNORMAL LOW (ref 13.0–17.0)
MCH: 29.1 pg (ref 26.0–34.0)
MCHC: 32 g/dL (ref 30.0–36.0)
MCV: 90.9 fL (ref 80.0–100.0)
Platelets: 206 10*3/uL (ref 150–400)
RBC: 4.16 MIL/uL — ABNORMAL LOW (ref 4.22–5.81)
RDW: 13.5 % (ref 11.5–15.5)
WBC: 12.7 10*3/uL — ABNORMAL HIGH (ref 4.0–10.5)
nRBC: 0 % (ref 0.0–0.2)

## 2019-01-28 LAB — TROPONIN I (HIGH SENSITIVITY): Troponin I (High Sensitivity): 71 ng/L — ABNORMAL HIGH (ref <18)

## 2019-01-28 LAB — HIV ANTIBODY (ROUTINE TESTING W REFLEX): HIV Screen 4th Generation wRfx: NONREACTIVE

## 2019-01-28 LAB — D-DIMER, QUANTITATIVE: D-Dimer, Quant: 1.43 ug/mL-FEU — ABNORMAL HIGH (ref 0.00–0.50)

## 2019-01-28 LAB — SARS CORONAVIRUS 2 (TAT 6-24 HRS): SARS Coronavirus 2: POSITIVE — AB

## 2019-01-28 LAB — TSH: TSH: 2.213 u[IU]/mL (ref 0.350–4.500)

## 2019-01-28 MED ORDER — CHLORHEXIDINE GLUCONATE CLOTH 2 % EX PADS
6.0000 | MEDICATED_PAD | Freq: Every day | CUTANEOUS | Status: DC
  Administered 2019-01-28 – 2019-01-30 (×3): 6 via TOPICAL

## 2019-01-28 NOTE — ED Notes (Signed)
Attempted lab draw, will request next RN to attempt

## 2019-01-28 NOTE — Progress Notes (Signed)
PHARMACY - PHYSICIAN COMMUNICATION CRITICAL VALUE ALERT - BLOOD CULTURE IDENTIFICATION (BCID)  MILON DETHLOFF is an 63 y.o. male who presented to [facility] on 01/27/2019   Assessment:  gpc in 1/4 blood cx anaerobic - no bcid covid pos  Current antibiotics: vanc cefepime  Changes to prescribed antibiotics recommended:  None - contaminant  Barth Kirks, PharmD, BCPS, BCCCP Clinical Pharmacist [phone]  Please check AMION for all [place] numbers  01/28/2019 6:40 PM

## 2019-01-28 NOTE — ED Notes (Signed)
Breakfast tray ordered 

## 2019-01-28 NOTE — Progress Notes (Signed)
PROGRESS NOTE    Bruce Gonzales  IWP:[id number] DOB: 04/23/1955 DOA: 01/27/2019 PCP: Harlan Stains, MD   Brief Narrative:  63 year old male with history of TBI, COVID-19 positive about 2 weeks back presented to the emergency department with the blood in the diaper.  Family denied having any fever, chills, cough, nausea, vomiting, diarrhea, bright red blood per rectum.  Patient was noticed to have gross hematuria.  Urine analysis in the emergency department showed WBC of 11-20, RBC 11-20.  Patient is found to have elevated white blood cell count of 19 point 8K with left shift.  Patient's initial lactic acid was 4 with IV fluids improved to 1.8.  Blood cultures were obtained.  Patient is also found to have elevated LFTs with a total bilirubin of 2.9, alkaline phosphatase of 106.  Patient is started on vancomycin, cefepime.  Repeat COVID-19 test came back to be positive.  CT abdomen and pelvis showed thickened bladder wall could be related to bladder outlet obstruction with the enlarged prostate, could also represent cystitis.  Gallbladder is unremarkable, no focal hepatic abnormality.  Chest x-ray does not have any acute cardiopulmonary disease.  Assessment & Plan:   Principal Problem:   Sepsis ([place]) Active Problems:   Acute lower UTI   COVID-19 virus infection   ##Sepsis secondary to urinary tract infection -Follow-up with the blood and urine cultures -Currently on vancomycin, cefepime -WBC trending down 19.8->12  Urinary tract infection -Follow-up with urine cultures -Continue the current antibiotic regimen  Elevated LFTs -CT abdomen and pelvis is unremarkable -Get hepatitis panel -Ultrasound of the right upper quadrant  Acute renal insufficiency -Continue with IV fluids and follow-up  COVID-19 positive -Patient continues to be asymptomatic -Chest x-ray did not show any acute cardiopulmonary disease -Get inflammatory markers -Continue with contact precautions     DVT  prophylaxis: Lovenox  code Status: Full code Family Communication: Attempted to call, no response Disposition Plan: Home   Antimicrobials: Vancomycin 01/28/2019- Cefepime 01/28/2019  Subjective: Unable to get any history from the patient.  Objective: Vitals:   01/28/19 0715 01/28/19 0830 01/28/19 0930 01/28/19 1000  BP:    103/62  Pulse: (!) 102 (!) 109 (!) 102 (!) 106  Resp: (!) 26 (!) 26 (!) 24   Temp:      TempSrc:      SpO2: 94% 97% 99% 100%  Weight:        Intake/Output Summary (Last 24 hours) at 01/28/2019 1002 Last data filed at 01/28/2019 0614 Gross per 24 hour  Intake 1200 ml  Output 345 ml  Net 855 ml   Filed Weights   01/27/19 2024  Weight: 73.8 kg    Examination:  General exam: Appears calm and comfortable  Respiratory system: Clear to auscultation. Respiratory effort normal. Cardiovascular system: S1 & S2 heard, RRR. No JVD, murmurs, rubs, gallops or clicks. No pedal edema. Gastrointestinal system: Abdomen is nondistended, soft and nontender. No organomegaly or masses felt. Normal bowel sounds heard. Central nervous system: Alert and not oriented x3. No focal neurological deficits. Extremities: Symmetric 5 x 5 power. Skin: No rashes, lesions or ulcers Psychiatry: No judgement and insight . Mood & affect appropriate.     Data Reviewed: I have personally reviewed following labs and imaging studies  CBC: Recent Labs  Lab 01/27/19 1544 01/28/19 0730  WBC 19.8* 12.7*  HGB 14.0 12.1*  HCT 43.0 37.8*  MCV 89.6 90.9  PLT 285 505   Basic Metabolic Panel: Recent Labs  Lab 01/27/19 1544 01/28/19  0730  NA 138 139  K 3.9 3.7  CL 98 105  CO2 27 23  GLUCOSE 119* 99  BUN 15 14  CREATININE 1.25* 1.19  CALCIUM 9.7 8.6*   GFR: CrCl cannot be calculated (Unknown ideal weight.). Liver Function Tests: Recent Labs  Lab 01/27/19 1904 01/28/19 0730  AST 21 25  ALT 23 22  ALKPHOS 106 101  BILITOT 2.9* 3.0*  PROT 8.0 6.8  ALBUMIN 3.1* 2.6*    No results for input(s): LIPASE, AMYLASE in the last 168 hours. No results for input(s): AMMONIA in the last 168 hours. Coagulation Profile: Recent Labs  Lab 01/27/19 1904  INR 1.2   Cardiac Enzymes: No results for input(s): CKTOTAL, CKMB, CKMBINDEX, TROPONINI in the last 168 hours. BNP (last 3 results) No results for input(s): PROBNP in the last 8760 hours. HbA1C: No results for input(s): HGBA1C in the last 72 hours. CBG: No results for input(s): GLUCAP in the last 168 hours. Lipid Profile: No results for input(s): CHOL, HDL, LDLCALC, TRIG, CHOLHDL, LDLDIRECT in the last 72 hours. Thyroid Function Tests: Recent Labs    01/28/19 0731  TSH 2.213   Anemia Panel: No results for input(s): VITAMINB12, FOLATE, FERRITIN, TIBC, IRON, RETICCTPCT in the last 72 hours. Sepsis Labs: Recent Labs  Lab 01/27/19 1904 01/27/19 2235  LATICACIDVEN 4.0* 1.8    Recent Results (from the past 240 hour(s))  Blood Culture (routine x 2)     Status: None (Preliminary result)   Collection Time: 01/27/19  7:04 PM   Specimen: BLOOD  Result Value Ref Range Status   Specimen Description BLOOD RIGHT ANTECUBITAL  Final   Special Requests   Final    BOTTLES DRAWN AEROBIC AND ANAEROBIC Blood Culture adequate volume   Culture   Final    NO GROWTH < 12 HOURS Performed at [facility], [address], [place], [place] [address]    Report Status PENDING  Incomplete  Blood Culture (routine x 2)     Status: None (Preliminary result)   Collection Time: 01/27/19  7:30 PM   Specimen: BLOOD  Result Value Ref Range Status   Specimen Description BLOOD LEFT ANTECUBITAL  Final   Special Requests   Final    BOTTLES DRAWN AEROBIC AND ANAEROBIC Blood Culture results may not be optimal due to an inadequate volume of blood received in culture bottles   Culture   Final    NO GROWTH < 12 HOURS Performed at [facility], [address] [place], [place] [address]    Report Status PENDING  Incomplete   SARS CORONAVIRUS 2 (TAT 6-24 HRS) Nasopharyngeal Nasopharyngeal Swab     Status: Abnormal   Collection Time: 01/27/19 11:53 PM   Specimen: Nasopharyngeal Swab  Result Value Ref Range Status   SARS Coronavirus 2 POSITIVE (A) NEGATIVE Final    Comment: RESULT CALLED TO, READ BACK BY AND VERIFIED WITH: RN N FOUNTAIN @0740  01/28/19 BY S GEZAHEGN (NOTE) SARS-CoV-2 target nucleic acids are DETECTED. The SARS-CoV-2 RNA is generally detectable in upper and lower respiratory specimens during the acute phase of infection. Positive results are indicative of the presence of SARS-CoV-2 RNA. Clinical correlation with patient history and other diagnostic information is  necessary to determine patient infection status. Positive results do not rule out bacterial infection or co-infection with other viruses.  The expected result is Negative. Fact Sheet for Patients: 01/30/19 Fact Sheet for Healthcare Providers: HairSlick.no This test is not yet approved or cleared by the  [employer] FDA and  has been authorized for detection and/or diagnosis of SARS-CoV-2 by FDA under an TEFL teachermergency Use Authorization (EUA). This EUA will remain  in effect (meaning this test can be used)  for the duration of the COVID-19 declaration under Section 564(b)(1) of the Act, 21 U.S.C. section 360bbb-3(b)(1), unless the authorization is terminated or revoked sooner. Performed at [facility], [address] [place], [place] [address]          Radiology Studies: Ct Abdomen Pelvis W Contrast  Result Date: 01/27/2019 CLINICAL DATA:  Abdominal distention. EXAM: CT ABDOMEN AND PELVIS WITH CONTRAST TECHNIQUE: Multidetector CT imaging of the abdomen and pelvis was performed using the standard protocol following bolus administration of intravenous contrast. CONTRAST:  100mL OMNIPAQUE IOHEXOL 300 MG/ML  SOLN COMPARISON:  None. FINDINGS: Lower chest: Lung  bases are clear. No effusions. Heart is normal size. Hepatobiliary: No focal hepatic abnormality. Gallbladder unremarkable. Pancreas: No focal abnormality or ductal dilatation. Spleen: No focal abnormality.  Normal size. Adrenals/Urinary Tract: No hydronephrosis. No renal or adrenal mass. Urinary bladder appears thick walled, decompressed. Stomach/Bowel: Normal appendix. Stomach, large and small bowel grossly unremarkable. Vascular/Lymphatic: Aortic atherosclerosis. No enlarged abdominal or pelvic lymph nodes. Reproductive: Prostate enlargement. Other: No free fluid or free air. Musculoskeletal: No acute bony abnormality. IMPRESSION: Thickened bladder wall. This could be related to bladder outlet obstruction with the enlarged prostate. This could also represent cystitis. Recommend clinical correlation. Aortic atherosclerosis. Electronically Signed   By: Charlett NoseKevin  Dover M.D.   On: 01/27/2019 20:42   Dg Chest Port 1 View  Result Date: 01/27/2019 CLINICAL DATA:  Patient diagnosed with COVID-19 2 weeks ago. Confusion. EXAM: PORTABLE CHEST 1 VIEW COMPARISON:  None. FINDINGS: The heart size and mediastinal contours are within normal limits. Both lungs are clear. The visualized skeletal structures are unremarkable. IMPRESSION: No active disease. Electronically Signed   By: Gerome Samavid  Williams III M.D   On: 01/27/2019 18:10        Scheduled Meds:  atorvastatin  40 mg Oral QHS   Continuous Infusions:  ceFEPime (MAXIPIME) IV Stopped (01/28/19 0353)   vancomycin     Followed by   vancomycin       LOS: 1 day    Time spent: 35 minutes   Markeria Goetsch, MD [facility] Pager [phone]  If 7PM-7AM, please contact night-coverage [email or web address] Password [email or web address] 01/28/2019, 10:02 AM

## 2019-01-28 NOTE — ED Notes (Signed)
Discussed Vancomycin with Pharmacy. RN will continue 2 g dose of vancomycin and hold 1.75 g of vancomycin.

## 2019-01-28 NOTE — ED Notes (Addendum)
Lab draws attempted multiple times by multiple people. No success. RN Mel Almond aware and also attempted

## 2019-01-29 ENCOUNTER — Inpatient Hospital Stay (HOSPITAL_COMMUNITY): Payer: Medicare HMO

## 2019-01-29 DIAGNOSIS — I341 Nonrheumatic mitral (valve) prolapse: Secondary | ICD-10-CM

## 2019-01-29 LAB — COMPREHENSIVE METABOLIC PANEL
ALT: 24 U/L (ref 0–44)
AST: 30 U/L (ref 15–41)
Albumin: 2.5 g/dL — ABNORMAL LOW (ref 3.5–5.0)
Alkaline Phosphatase: 89 U/L (ref 38–126)
Anion gap: 13 (ref 5–15)
BUN: 16 mg/dL (ref 8–23)
CO2: 20 mmol/L — ABNORMAL LOW (ref 22–32)
Calcium: 8.4 mg/dL — ABNORMAL LOW (ref 8.9–10.3)
Chloride: 106 mmol/L (ref 98–111)
Creatinine, Ser: 1.13 mg/dL (ref 0.61–1.24)
GFR calc Af Amer: >60 mL/min (ref >60)
GFR calc non Af Amer: >60 mL/min (ref >60)
Glucose, Bld: 107 mg/dL — ABNORMAL HIGH (ref 70–99)
Potassium: 3.4 mmol/L — ABNORMAL LOW (ref 3.5–5.1)
Sodium: 139 mmol/L (ref 135–145)
Total Bilirubin: 2.1 mg/dL — ABNORMAL HIGH (ref 0.3–1.2)
Total Protein: 6.4 g/dL — ABNORMAL LOW (ref 6.5–8.1)

## 2019-01-29 LAB — ECHOCARDIOGRAM COMPLETE
Height: 70 in
Weight: 2970.04 oz

## 2019-01-29 MED ORDER — SODIUM CHLORIDE 0.9 % IV SOLN
1.0000 g | INTRAVENOUS | Status: DC
  Administered 2019-01-29: 1 g via INTRAVENOUS
  Filled 2019-01-29: qty 1
  Filled 2019-01-29: qty 10

## 2019-01-29 MED ORDER — POTASSIUM CHLORIDE CRYS ER 20 MEQ PO TBCR
40.0000 meq | EXTENDED_RELEASE_TABLET | Freq: Once | ORAL | Status: AC
  Administered 2019-01-29: 40 meq via ORAL
  Filled 2019-01-29: qty 2

## 2019-01-29 NOTE — Progress Notes (Signed)
  Echocardiogram 2D Echocardiogram has been performed.  Bruce Gonzales 01/29/2019, 3:35 PM

## 2019-01-29 NOTE — Progress Notes (Signed)
   Vital Signs MEWS/VS Documentation      01/28/2019 2300 01/28/2019 2349 01/29/2019 0133 01/29/2019 0302   MEWS Score:  4  0  0  0   MEWS Score Color:  Red  Green  Green  Green   Resp:  -  18  -  20   Pulse:  -  86  -  91   BP:  -  (!) 103/56  -  114/66   Temp:  -  98.8 F (37.1 C)  98 F (36.7 C)  97.8 F (36.6 C)   O2 Device:  -  Room Air  -  Room Air   Level of Consciousness:  Alert  Alert  -  Alert     Patients MEWS score red. Rapid Response nurse, charge nurse and MD notified as per policy. No new orders given. Patient receiving antibiotics, and given Tylenol. Throughout the time the patient showed no signs or symptoms of distress and remained at baseline. Pt verbal and repeats phrases and will occasionally mimic staff however rarely follows commands.       Daleen Squibb Elliana Bal 01/29/2019,3:21 AM

## 2019-01-29 NOTE — Progress Notes (Signed)
PROGRESS NOTE    Bruce Gonzales  EGB:[id number] DOB: 19-Aug-1955 DOA: 01/27/2019 PCP: Laurann [place], MD      Brief Narrative:  Bruce Gonzales is a 63 y.o. 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  Per report, the patient had tested positive for Covid on November 11, quarantined until November 25.  In the ER, he was noted to have blood and clots in his urine, fever to 10 one half.  Lactate 4, CT of the abdomen and pelvis showed thickening of the bladder.  Urine sample appeared infected.  Chest x-ray clear.  WBC 19 K.  Started on antibiotics given 30 cc/kg IV fluids and admitted to the hospital service.     Assessment & Plan:  Sepsis from UTI Patient presented with lactic acid 4, fever, tachycardia and mild AKI.  CT imaging, symptoms/hematuria, and culture data suggest Proteus urinary infection. -Narrow antibiotics to ceftriaxone -Follow cultures and sensitivities   Recent asymptomatic SARS-CoV-2 infection now resolved Patient's caregiver tested positive a month ago, and patient was tested for Covid on or around November 11 (per sister-in-law)  due to that exposure.  He tested positive, but never had symptoms.  He was in isolation for 14 days, ending November 25 sister-in-law is quite sure.  She does not have documentation of this test result.   -If documentation of the initial positive test can be obtained, the patient can discontinue isolation  TBI with resulting cognitive impairment  Hypokalemia Mild -Check mag -Supplement K  Hyperlipidemia -Continue atorvastatin  Elevated troponin No chest pain headache time, suspect this is an incidental finding, the level is extremely low, and I do not feel ischemic work-up is warranted.  Will repeat tomorrow morning to confirm that this has peaked.  Acute blood loss anemia Hemoglobin dropped from 14-12, now stable.  There is an insignificant drop, likely from  hematuria.  FOBT was negative.  Do not suspect GI blood loss.  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  Direct bilirubin normal, ultrasound of the abdomen unremarkable.  Blood cultures 1 out of 4 blood culture bottles positive for coag negative staph, likely contaminant    MDM and disposition: The below labs and imaging reports were reviewed and summarized above.  Medication management as above.  The patient was admitted with UTI sepsis.  When his cultures mature, likely tomorrow, we can narrow to oral antibiotics and discharge home.        DVT prophylaxis: SCDs Code Status: Full code Family Communication: Sister-in-law by phone    Consultants:     Procedures:   11/29 CT abdomen and pelvis-gallbladder thickening only  11/30 ultrasound abdomen-no acute disease  Antimicrobials:   Vancomycin and Zosyn 11/29-12/1  Ceftriaxone 12/1>>  Culture data:  11/29 blood culture x2-1 bottle with coag negative staph  11/29 urine culture-Proteus, sensitivities pending      Subjective: Nursing have no complaints.  He was febrile overnight.  No respiratory distress, agitation.  He has had still some liquid stools.  Objective: Vitals:   01/28/19 2349 01/29/19 0133 01/29/19 0302 01/29/19 0800  BP: (!) 103/56  114/66 120/81  Pulse: 86  91 (!) 102  Resp: 18  20 16   Temp: 98.8 F (37.1 C) 98 F (36.7 C) 97.8 F (36.6 C) 99.6 F (37.6 C)  TempSrc: Oral Axillary Axillary Axillary  SpO2: 98%  98% 98%  Weight:      Height:  Intake/Output Summary (Last 24 hours) at 01/29/2019 1435 Last data filed at 01/29/2019 0131 Gross per 24 hour  Intake 160 ml  Output 1250 ml  Net -1090 ml   Filed Weights   01/27/19 2024 01/28/19 1502  Weight: 73.8 kg 84.2 kg    Examination: General appearance:  adult male, alert and in no acute distress.  Lying in bed, makes eye contact. HEENT: Anicteric, conjunctiva pink, lids and lashes normal. No  nasal deformity, discharge, epistaxis.  Lips moist, dentition poor, oropharynx moist, no oral lesions.   Skin: Warm and dry.  No suspicious rashes or lesions. Cardiac: Tachycardic, regular, nl S1-S2, no murmurs appreciated.  Capillary refill is brisk.  JVPnot visible.  No LE edema.  Radial pulses 2+ and symmetric. Respiratory: Normal respiratory rate and rhythm.  CTAB without rales or wheezes. Abdomen: Abdomen soft.  No mass to palpation or guarding. No ascites, distension, hepatosplenomegaly.   MSK: No deformities or effusions. Neuro: Awake and makes eye contact.  Extraocular movements appear intact, patient is able to repeat the phrase "you have two eyes" fluently.  Appears to move both upper extremities with symmetric strength.   Psych: Does not answer questions.  Only repeats the phrase "you have to eyes" over and over, does seem to be following commands, judgment and insight appear extremely impaired by cognitive impairment       Data Reviewed: I have personally reviewed following labs and imaging studies:  CBC: Recent Labs  Lab 01/27/19 1544 01/28/19 0730  WBC 19.8* 12.7*  HGB 14.0 12.1*  HCT 43.0 37.8*  MCV 89.6 90.9  PLT 285 206   Basic Metabolic Panel: Recent Labs  Lab 01/27/19 1544 01/28/19 0730 01/29/19 0059  NA 138 139 139  K 3.9 3.7 3.4*  CL 98 105 106  CO2 27 23 20*  GLUCOSE 119* 99 107*  BUN 15 14 16   CREATININE 1.25* 1.19 1.13  CALCIUM 9.7 8.6* 8.4*   GFR: Estimated Creatinine Clearance: 68.2 mL/min (by C-G formula based on SCr of 1.13 mg/dL). Liver Function Tests: Recent Labs  Lab 01/27/19 1904 01/28/19 0730 01/29/19 0059  AST 21 25 30   ALT 23 22 24   ALKPHOS 106 101 89  BILITOT 2.9* 3.0* 2.1*  PROT 8.0 6.8 6.4*  ALBUMIN 3.1* 2.6* 2.5*   No results for input(s): LIPASE, AMYLASE in the last 168 hours. No results for input(s): AMMONIA in the last 168 hours. Coagulation Profile: Recent Labs  Lab 01/27/19 1904  INR 1.2   Cardiac  Enzymes: Recent Labs  Lab 01/28/19 0729  CKTOTAL 296  CKMB 3.7   BNP (last 3 results) No results for input(s): PROBNP in the last 8760 hours. HbA1C: No results for input(s): HGBA1C in the last 72 hours. CBG: No results for input(s): GLUCAP in the last 168 hours. Lipid Profile: No results for input(s): CHOL, HDL, LDLCALC, TRIG, CHOLHDL, LDLDIRECT in the last 72 hours. Thyroid Function Tests: Recent Labs    01/28/19 0731  TSH 2.213   Anemia Panel: No results for input(s): VITAMINB12, FOLATE, FERRITIN, TIBC, IRON, RETICCTPCT in the last 72 hours. Urine analysis:    Component Value Date/Time   COLORURINE AMBER (A) 01/27/2019 2035   APPEARANCEUR CLOUDY (A) 01/27/2019 2035   LABSPEC >1.046 (H) 01/27/2019 2035   PHURINE 5.0 01/27/2019 2035   GLUCOSEU NEGATIVE 01/27/2019 2035   HGBUR LARGE (A) 01/27/2019 2035   BILIRUBINUR NEGATIVE 01/27/2019 2035   KETONESUR 20 (A) 01/27/2019 2035   PROTEINUR 100 (A) 01/27/2019 2035  NITRITE NEGATIVE 01/27/2019 2035   LEUKOCYTESUR SMALL (A) 01/27/2019 2035   Sepsis Labs: (procalcitonin:4,lacticacidven:4)  ) Recent Results (from the past 240 hour(s))  Blood Culture (routine x 2)     Status: None (Preliminary result)   Collection Time: 01/27/19  7:04 PM   Specimen: BLOOD  Result Value Ref Range Status   Specimen Description BLOOD RIGHT ANTECUBITAL  Final   Special Requests   Final    BOTTLES DRAWN AEROBIC AND ANAEROBIC Blood Culture adequate volume   Culture   Final    NO GROWTH 2 DAYS Performed at [facility], [address] [place], [place] [address]    Report Status PENDING  Incomplete  Blood Culture (routine x 2)     Status: Abnormal (Preliminary result)   Collection Time: 01/27/19  7:30 PM   Specimen: BLOOD  Result Value Ref Range Status   Specimen Description BLOOD LEFT ANTECUBITAL  Final   Special Requests   Final    BOTTLES DRAWN AEROBIC AND ANAEROBIC Blood Culture results may not be optimal due to an  inadequate volume of blood received in culture bottles   Culture  Setup Time   Final    ANAEROBIC BOTTLE ONLY GRAM POSITIVE COCCI CRITICAL RESULT CALLED TO, READ BACK BY AND VERIFIED WITH: [employer] [facility] 01/28/19 1741 JDW    Culture (A)  Final    STAPHYLOCOCCUS SPECIES (COAGULASE NEGATIVE) THE SIGNIFICANCE OF ISOLATING THIS ORGANISM FROM A SINGLE SET OF BLOOD CULTURES WHEN MULTIPLE SETS ARE DRAWN IS UNCERTAIN. PLEASE NOTIFY THE MICROBIOLOGY DEPARTMENT WITHIN ONE WEEK IF SPECIATION AND SENSITIVITIES ARE REQUIRED. Performed at [facility], [address], [place], [place] [address]    Report Status PENDING  Incomplete  Urine culture     Status: Abnormal (Preliminary result)   Collection Time: 01/27/19  8:35 PM   Specimen: Urine, Random  Result Value Ref Range Status   Specimen Description URINE, RANDOM  Final   Special Requests NONE  Final   Culture (A)  Final    80,000 COLONIES/mL PROTEUS MIRABILIS SUSCEPTIBILITIES TO FOLLOW Performed at [facility], [address] [place], [place] [address]    Report Status PENDING  Incomplete  SARS CORONAVIRUS 2 (TAT 6-24 HRS) Nasopharyngeal Nasopharyngeal Swab     Status: Abnormal   Collection Time: 01/27/19 11:53 PM   Specimen: Nasopharyngeal Swab  Result Value Ref Range Status   SARS Coronavirus 2 POSITIVE (A) NEGATIVE Final    Comment: RESULT CALLED TO, READ BACK BY AND VERIFIED WITH: RN N FOUNTAIN  01/28/19 BY S GEZAHEGN (NOTE) SARS-CoV-2 target nucleic acids are DETECTED. The SARS-CoV-2 RNA is generally detectable in upper and lower respiratory specimens during the acute phase of infection. Positive results are indicative of the presence of SARS-CoV-2 RNA. Clinical correlation with patient history and other diagnostic information is  necessary to determine patient infection status. Positive results do not rule out bacterial infection or co-infection with other viruses.  The expected result is Negative. Fact Sheet for  Patients: HairSlick.no Fact Sheet for Healthcare Providers: quierodirigir.com This test is not yet approved or cleared by the [place] FDA and  has been authorized for detection and/or diagnosis of SARS-CoV-2 by FDA under an Emergency Use Authorization (EUA). This EUA will remain  in effect (meaning this test can be used)  for the duration of the COVID-19 declaration under Section 564(b)(1) of the Act, 21 U.S.C. section 360bbb-3(b)(1), unless the authorization is terminated or revoked sooner. Performed at [facility], [place] [address] [place], [place] [address]          Radiology Studies: Ct Abdomen Pelvis W Contrast  Result Date: 01/27/2019 CLINICAL DATA:  Abdominal distention. EXAM: CT ABDOMEN AND PELVIS WITH CONTRAST TECHNIQUE: Multidetector CT imaging of the abdomen and pelvis was performed using the standard protocol following bolus administration of intravenous contrast. CONTRAST:  155mL OMNIPAQUE IOHEXOL 300 MG/ML  SOLN COMPARISON:  None. FINDINGS: Lower chest: Lung bases are clear. No effusions. Heart is normal size. Hepatobiliary: No focal hepatic abnormality. Gallbladder unremarkable. Pancreas: No focal abnormality or ductal dilatation. Spleen: No focal abnormality.  Normal size. Adrenals/Urinary Tract: No hydronephrosis. No renal or adrenal mass. Urinary bladder appears thick walled, decompressed. Stomach/Bowel: Normal appendix. Stomach, large and small bowel grossly unremarkable. Vascular/Lymphatic: Aortic atherosclerosis. No enlarged abdominal or pelvic lymph nodes. Reproductive: Prostate enlargement. Other: No free fluid or free air. Musculoskeletal: No acute bony abnormality. IMPRESSION: Thickened bladder wall. This could be related to bladder outlet obstruction with the enlarged prostate. This could also represent cystitis. Recommend clinical correlation. Aortic atherosclerosis. Electronically Signed   By: Rolm Baptise M.D.   On: 01/27/2019 20:42   US Abdomen Limited  Result Date: 01/28/2019 CLINICAL DATA:  Elevated liver enzymes EXAM: ULTRASOUND ABDOMEN LIMITED RIGHT UPPER QUADRANT COMPARISON:  CT abdomen and pelvis January 27, 2019. FINDINGS: Gallbladder: No gallstones or wall thickening visualized. There is no pericholecystic fluid. No sonographic Murphy sign noted by sonographer. Common bile duct: Diameter: 4 mm. No intrahepatic or extrahepatic biliary duct dilatation. Liver: No focal lesion identified. Within normal limits in parenchymal echogenicity. Portal vein is patent on color Doppler imaging with normal direction of blood flow towards the liver. Other: None. IMPRESSION: Study within normal limits. Electronically Signed   By: Lowella Grip III M.D.   On: 01/28/2019 11:18   Dg Chest Port 1 View  Result Date: 01/27/2019 CLINICAL DATA:  Patient diagnosed with COVID-19 2 weeks ago. Confusion. EXAM: PORTABLE CHEST 1 VIEW COMPARISON:  None. FINDINGS: The heart size and mediastinal contours are within normal limits. Both lungs are clear. The visualized skeletal structures are unremarkable. IMPRESSION: No active disease. Electronically Signed   By: Dorise Bullion III M.D   On: 01/27/2019 18:10        Scheduled Meds:  atorvastatin  40 mg Oral QHS   Chlorhexidine Gluconate Cloth  6 each Topical Daily   Continuous Infusions:  cefTRIAXone (ROCEPHIN)  IV       LOS: 2 days    Time spent: 25 minutes    Edwin Dada, MD [facility] 01/29/2019, 2:35 PM     Please page though [place] or Epic secure chat:  For [employer], Adult nurse

## 2019-01-30 ENCOUNTER — Telehealth: Payer: Self-pay

## 2019-01-30 DIAGNOSIS — A419 Sepsis, unspecified organism: Principal | ICD-10-CM

## 2019-01-30 LAB — COMPREHENSIVE METABOLIC PANEL
ALT: 26 U/L (ref 0–44)
AST: 33 U/L (ref 15–41)
Albumin: 2.4 g/dL — ABNORMAL LOW (ref 3.5–5.0)
Alkaline Phosphatase: 97 U/L (ref 38–126)
Anion gap: 11 (ref 5–15)
BUN: 14 mg/dL (ref 8–23)
CO2: 22 mmol/L (ref 22–32)
Calcium: 8.6 mg/dL — ABNORMAL LOW (ref 8.9–10.3)
Chloride: 106 mmol/L (ref 98–111)
Creatinine, Ser: 1.13 mg/dL (ref 0.61–1.24)
GFR calc Af Amer: >60 mL/min (ref >60)
GFR calc non Af Amer: >60 mL/min (ref >60)
Glucose, Bld: 108 mg/dL — ABNORMAL HIGH (ref 70–99)
Potassium: 3.6 mmol/L (ref 3.5–5.1)
Sodium: 139 mmol/L (ref 135–145)
Total Bilirubin: 1 mg/dL (ref 0.3–1.2)
Total Protein: 6.9 g/dL (ref 6.5–8.1)

## 2019-01-30 LAB — CBC
HCT: 37.6 % — ABNORMAL LOW (ref 39.0–52.0)
Hemoglobin: 12.2 g/dL — ABNORMAL LOW (ref 13.0–17.0)
MCH: 28.7 pg (ref 26.0–34.0)
MCHC: 32.4 g/dL (ref 30.0–36.0)
MCV: 88.5 fL (ref 80.0–100.0)
Platelets: 182 10*3/uL (ref 150–400)
RBC: 4.25 MIL/uL (ref 4.22–5.81)
RDW: 14 % (ref 11.5–15.5)
WBC: 8 10*3/uL (ref 4.0–10.5)
nRBC: 0 % (ref 0.0–0.2)

## 2019-01-30 LAB — CULTURE, BLOOD (ROUTINE X 2)

## 2019-01-30 LAB — URINE CULTURE: Culture: 80000 — AB

## 2019-01-30 LAB — TROPONIN I (HIGH SENSITIVITY): Troponin I (High Sensitivity): 50 ng/L — ABNORMAL HIGH (ref <18)

## 2019-01-30 LAB — MAGNESIUM: Magnesium: 2 mg/dL (ref 1.7–2.4)

## 2019-01-30 MED ORDER — AMPICILLIN 500 MG PO CAPS: 500.0000 mg | ORAL_CAPSULE | Freq: Three times a day (TID) | ORAL | 0 refills | Status: AC

## 2019-01-30 MED ORDER — POTASSIUM CHLORIDE ER 10 MEQ PO TBCR: 10.0000 meq | EXTENDED_RELEASE_TABLET | Freq: Every day | ORAL | 0 refills | Status: DC

## 2019-01-30 MED ORDER — AMPICILLIN 500 MG PO CAPS
500.0000 mg | ORAL_CAPSULE | Freq: Four times a day (QID) | ORAL | Status: DC
  Filled 2019-01-30 (×2): qty 1

## 2019-01-30 MED ORDER — SODIUM CHLORIDE 0.9 % IV BOLUS
500.0000 mL | Freq: Once | INTRAVENOUS | Status: AC
  Administered 2019-01-30: 500 mL via INTRAVENOUS

## 2019-01-30 NOTE — Telephone Encounter (Signed)
° ° °  Spoke with April, the niece she states she already knows a Film/video editor and prefers to use that group. She states she will contact that provider for appointment

## 2019-01-30 NOTE — Telephone Encounter (Signed)
-----   Message from Darreld Mclean, [place] sent at 01/30/2019 12:03 PM EST ----- Regarding: Bruce Gonzales Patient being discharged after being admitted for UTI and sepsis. COVID positive. Patient being discharged today. We did not see patient during this admission but hospitalist is requesting a new patient follow-up for abnormal Echo and minimally elevated troponin during admission. Since patient is COVID positive, probably need to wait at least 3 weeks before we see patient in our office. Can you help schedule new patient appointment with any available MD in about 1 month?  Thank you! Callie

## 2019-01-30 NOTE — TOC Transition Note (Signed)
Transition of Care [facility]) - CM/SW Discharge Note   Patient Details  Name: Bruce Gonzales MRN: [id number] Date of Birth: Nov 19, 1955  Transition of [facility] CM/SW Contact:  Maryclare Labrador, RN Phone Number: 01/30/2019, 3:13 PM   Clinical Narrative:   PTA from with home with wife.  Pt has hx of TBI and has had 24 hour care for the last 30 years.  Pt has PCS services from [facility] 8 hours a day 5 days a week,  Wife provides supervision at all other times.  Per therapy cognitive documentation CM reached out to wife for [facility] choice.  Wife requested CM speak with pt about recommendation of HH.  CM spoke with via the assistance of his bedside nurse - pt declined [facility] as recommended.  Wife in agreement - CM informed her that if she thought pt needs it once discharged that his PCP could arrange.  Wife denied barriers/concerns with pt returning home and informed CM that pt is independent with mobility - the aide just provides supervision.  Wife will arrange post follow up app with PCP and will pick pt up from hospital      Final next level of care: [place] Barriers to Discharge: No Barriers Identified   Patient Goals and CMS Choice        Discharge Placement                       Discharge Plan and Services                                     Social Determinants of Health (SDOH) Interventions     Readmission Risk Interventions No flowsheet data found.

## 2019-01-30 NOTE — Evaluation (Signed)
Occupational Therapy Evaluation Patient Details Name: Bruce Gonzales MRN: [id number] DOB: 09-Sep-1955 Today's Date: 01/30/2019    History of Present Illness Pt is a 63 yo male s/p UTI and blood clots in urine. COVID+. PMHx; TBI and cognitive deficits.   Clinical Impression   Pt currently modA for bed mobility; minA for sit to stands and ADL functional transfers. Pt able to take a few steps to [facility] with RW and hand held assist. Pt minA for UB ADL and maxA for LB ADL. Pt following 50% of commands; requires initial sequencing and stability for ADL and mobility. Pt automatically transferring to [facility] when he saw it next to his bed after sit to stand. Pt limited by decreased strength and decreased activity tolerance in accordance with h/o TBI. Pt does not require continued acute OT. Pt only able say name, part of birthday and repeats "I am going to die" and  "I have 3 eyes."  Pt appears close to baseline level. OT continued in next setting if possible. OT signing off.     Follow Up Recommendations  Home health OT;Supervision/Assistance - 24 hour    Equipment Recommendations  None recommended by OT    Recommendations for Other Services       Precautions / Restrictions Precautions Precautions: Fall;Other (comment) Precaution Comments: airborne/contact Restrictions Weight Bearing Restrictions: No      Mobility Bed Mobility Overal bed mobility: Needs Assistance Bed Mobility: Supine to Sit;Sit to Supine     Supine to sit: Mod assist Sit to supine: Mod assist   General bed mobility comments: assist to initiate movements  Transfers Overall transfer level: Needs assistance Equipment used: Rolling walker (2 wheeled);None Transfers: Sit to/from [employer] to Stand: Min assist Stand pivot transfers: Min assist       General transfer comment: MinA overall for power up and pivot to [facility]    Balance Overall balance assessment: Needs assistance   Sitting  balance-Leahy Scale: Fair     Standing balance support: Bilateral upper extremity supported Standing balance-Leahy Scale: Poor                             ADL either performed or assessed with clinical judgement   ADL Overall ADL's : Needs assistance/impaired Eating/Feeding: Set up;Sitting   Grooming: Moderate assistance;Sitting;Cueing for safety;Cueing for sequencing   Upper Body Bathing: Minimal assistance;Sitting;Cueing for safety;Cueing for sequencing   Lower Body Bathing: Maximal assistance;Total assistance;Sit to/from stand;Sitting/lateral leans   Upper Body Dressing : Moderate assistance;Sitting;Standing;Cueing for safety   Lower Body Dressing: Maximal assistance;Total assistance;Cueing for safety;Sitting/lateral leans;Sit to/from stand   Toilet Transfer: Minimal assistance;Stand-pivot;Cueing for safety   Toileting- Clothing Manipulation and Hygiene: Maximal assistance;Cueing for safety;Sitting/lateral lean;Sit to/from stand       Functional mobility during ADLs: Minimal assistance;Cueing for safety;Rolling walker General ADL Comments: Pt with no awareness of soiling himself. MaxA to [place] to clean pt up and perform pericare. Pt limited by decreased strength and decreased activity tolerance in accordance with h/o TBI.     Vision Baseline Vision/History: No visual deficits Vision Assessment?: No apparent visual deficits     Perception     Praxis      Pertinent Vitals/Pain Pain Assessment: Faces Faces Pain Scale: Hurts little more Pain Location: bottom area during peri care Pain Descriptors / Indicators: Discomfort;Grimacing;Guarding Pain Intervention(s): Limited activity within patient's tolerance;Monitored during session     Hand Dominance Right   Extremity/Trunk Assessment Upper Extremity  Assessment Upper Extremity Assessment: Generalized weakness   Lower Extremity Assessment Lower Extremity Assessment: Generalized weakness   Cervical /  Trunk Assessment Cervical / Trunk Assessment: Normal   Communication Communication Communication: Expressive difficulties;Receptive difficulties(h/o TBI)   Cognition Arousal/Alertness: Awake/alert Behavior During Therapy: WFL for tasks assessed/performed Overall Cognitive Status: History of cognitive impairments - at baseline                                 General Comments: pt following 50% of commands; requires initial sequencing and stability for ADL and mobility. Pt automatically transferring to [facility] when he saw it next to his bed after sit to stand.   General Comments  Pt stating "I am going to die" and "I have 2 or 3 eyes."    Exercises     Shoulder Instructions      Home Living Family/patient expects to be discharged to:: Unsure Living Arrangements: Other (Comment)                               Additional Comments: Unknown group home versus home with family. Pt unable to describe it.      Prior Functioning/Environment Level of Independence: Needs assistance  Gait / Transfers Assistance Needed: Assist required ADL's / Homemaking Assistance Needed: Assist required            OT Problem List: Decreased strength;Decreased activity tolerance;Impaired balance (sitting and/or standing);Decreased safety awareness;Pain      OT Treatment/Interventions:      OT Goals(Current goals can be found in the care plan section)    OT Frequency:     Barriers to D/C:            Co-evaluation              AM-PAC OT "6 Clicks" Daily Activity     Outcome Measure Help from another person eating meals?: A Lot Help from another person taking care of personal grooming?: A Lot Help from another person toileting, which includes using toliet, bedpan, or urinal?: A Lot Help from another person bathing (including washing, rinsing, drying)?: A Little Help from another person to put on and taking off regular upper body clothing?: A Little Help from  another person to put on and taking off regular lower body clothing?: A Lot 6 Click Score: 14   End of Session Equipment Utilized During Treatment: Gait belt;Rolling walker Nurse Communication: Mobility status  Activity Tolerance: Patient tolerated treatment well Patient left: with call bell/phone within reach;in bed;with bed alarm set  OT Visit Diagnosis: Unsteadiness on feet (R26.81);Muscle weakness (generalized) (M62.81)                Time: 8101-7510 OT Time Calculation (min): 37 min Charges:  OT General Charges $OT Visit: 1 Visit OT Evaluation $OT Eval Moderate Complexity: 1 Mod  Cristi Loron) Glendell Docker OTR/L Acute Rehabilitation Services Pager: [phone] Office: [phone]   Lonzo Cloud 01/30/2019, 2:04 PM

## 2019-01-30 NOTE — Evaluation (Signed)
Physical Therapy Evaluation Patient Details Name: Bruce Gonzales MRN: [id number] DOB: 28-Oct-1955 Today's Date: 01/30/2019   History of Present Illness  Pt is a 63 yo male s/p UTI and blood clots in urine. COVID+. PMHx; TBI and cognitive deficits.  Clinical Impression  PTA pt living with wife with 24 hours supervision between her and caregiver who comes 8 hours a day. Pt limited in his safe mobility by decreased cognition in presence of generalized weakness. Pt requires modA for bed mobility, min A for transfers and minAx2 for ambulation of 2 feet with RW. Pt is likely close to his baseline level of function and will do much better in his home environment with familiar people. PT does not recommend any services or equipment at d/c. PT will continue to follow acutely for preventing further decline in mobility.     Follow Up Recommendations No PT follow up;Supervision/Assistance - 24 hour    Equipment Recommendations  None recommended by PT       Precautions / Restrictions Precautions Precautions: Fall;Other (comment) Precaution Comments: airborne/contact Restrictions Weight Bearing Restrictions: No      Mobility  Bed Mobility Overal bed mobility: Needs Assistance Bed Mobility: Supine to Sit;Sit to Supine     Supine to sit: Mod assist Sit to supine: Mod assist   General bed mobility comments: assist to initiate movements  Transfers Overall transfer level: Needs assistance Equipment used: Rolling walker (2 wheeled);None Transfers: Sit to/from [employer] to Stand: Min assist Stand pivot transfers: Min assist       General transfer comment: MinA overall for power up and pivot to [facility]  Ambulation/Gait Ambulation/Gait assistance: Min assist;+2 safety/equipment Gait Distance (Feet): 2 Feet Assistive device: Rolling walker (2 wheeled) Gait Pattern/deviations: Decreased step length - right;Decreased step length - left;Shuffle;Step-to pattern Gait velocity:  slowed Gait velocity interpretation: <1.31 ft/sec, indicative of household ambulator General Gait Details: min Ax2 for steadying with RW to ambulate 2 feet from [facility] back to bed         Balance Overall balance assessment: Needs assistance   Sitting balance-Leahy Scale: Fair     Standing balance support: Bilateral upper extremity supported Standing balance-Leahy Scale: Poor                               Pertinent Vitals/Pain Pain Assessment: Faces Faces Pain Scale: Hurts little more Pain Location: bottom area during peri care Pain Descriptors / Indicators: Discomfort;Grimacing;Guarding Pain Intervention(s): Limited activity within patient's tolerance;Monitored during session;Repositioned    Home Living Family/patient expects to be discharged to:: Unsure Living Arrangements: Other (Comment)               Additional Comments: Unknown group home versus home with family. Pt unable to describe it.    Prior Function Level of Independence: Needs assistance   Gait / Transfers Assistance Needed: Assist required  ADL's / Homemaking Assistance Needed: Assist required        Hand Dominance   Dominant Hand: Right    Extremity/Trunk Assessment   Upper Extremity Assessment Upper Extremity Assessment: Generalized weakness    Lower Extremity Assessment Lower Extremity Assessment: Generalized weakness    Cervical / Trunk Assessment Cervical / Trunk Assessment: Normal  Communication   Communication: Expressive difficulties;Receptive difficulties(h/o TBI)  Cognition Arousal/Alertness: Awake/alert Behavior During Therapy: WFL for tasks assessed/performed Overall Cognitive Status: History of cognitive impairments - at baseline  General Comments: pt following 50% of commands; requires initial sequencing and stability for ADL and mobility. Pt automatically transferring to [facility] when he saw it next to his bed after sit  to stand.      General Comments General comments (skin integrity, edema, etc.): Pt stating "I am going to die" and "I have 2 or 3 eyes."     Assessment/Plan    PT Assessment Patient needs continued PT services  PT Problem List Decreased strength;Decreased balance;Decreased mobility;Decreased coordination;Decreased cognition;Decreased knowledge of use of DME;Decreased safety awareness       PT Treatment Interventions DME instruction;Gait training;Functional mobility training;Therapeutic activities;Therapeutic exercise;Balance training;Cognitive remediation    PT Goals (Current goals can be found in the Care Plan section)  Acute Rehab PT Goals Patient Stated Goal: none stated PT Goal Formulation: With patient Potential to Achieve Goals: Fair    Frequency Min 3X/week    AM-PAC PT "6 Clicks" Mobility  Outcome Measure Help needed turning from your back to your side while in a flat bed without using bedrails?: A Little Help needed moving from lying on your back to sitting on the side of a flat bed without using bedrails?: A Lot Help needed moving to and from a bed to a chair (including a wheelchair)?: A Lot Help needed standing up from a chair using your arms (e.g., wheelchair or bedside chair)?: A Little Help needed to walk in hospital room?: A Lot Help needed climbing 3-5 steps with a railing? : Total 6 Click Score: 13    End of Session   Activity Tolerance: Patient tolerated treatment well Patient left: in bed;with call bell/phone within reach;with bed alarm set Nurse Communication: Mobility status PT Visit Diagnosis: Unsteadiness on feet (R26.81);Other abnormalities of gait and mobility (R26.89);Muscle weakness (generalized) (M62.81);Difficulty in walking, not elsewhere classified (R26.2);Other symptoms and signs involving the nervous system (X51.700)    Time: 1749-4496 PT Time Calculation (min) (ACUTE ONLY): 37 min   Charges:   PT Evaluation $PT Eval Moderate  Complexity: 1 Mod          Ariyanna Oien B. Migdalia Dk PT, DPT Acute Rehabilitation Services Pager [phone] Office [phone]   [place] 01/30/2019, 4:17 PM

## 2019-01-30 NOTE — Discharge Summary (Signed)
Physician Discharge Summary  SHERREL SHAFER ZOX:[id number] DOB: 11-10-1955 DOA: 01/27/2019  PCP: Laurann Montana, MD  Admit date: 01/27/2019 Discharge date: 01/30/2019  Admitted From: Home  Disposition: Home   Recommendations for Outpatient Follow-up:  1. Follow up with PCP in 1-2 weeks 2. Please obtain BMP/CBC in one week 3. Needs follow up with cardiologist for repeat ECHO. And abnormal ECHO.  4. Follow up on resolution of UTI.  5. Might need referral to urology due to hematuria and CT findings bladder wall thickenning  Home Health: none Equipment/Devices:none  Discharge Condition: stable.  CODE STATUS: full code Diet recommendation: Heart Healthy   Brief/Interim Summary: Mr. Skillin is a 63 y.o. 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  Per report, the patient had tested positive for Covid on November 11, quarantined until November 25.  In the ER, he was noted to have blood and clots in his urine, fevers,   Lactate 4, CT of the abdomen and pelvis showed thickening of the bladder.  Urine sample appeared infected.  Chest x-ray clear.  WBC 19 K.  Started on antibiotics given 30 cc/kg IV fluids and admitted to the hospital service.  Patient was treated with sepsis secondary to urinary tract infection with ceftriaxone for 3 days and IV fluids.  His urine culture grows Proteus sensitive to amoxicillin.  He will be discharged on 5 more days of amoxicillin.  He had a mild elevation of troponin and probably abnormal echocardiogram with some wall motion abnormalities.  He will need repeat echocardiogram when he is able to cooperate.  Cardiology follow-up has been arranged.  Sepsis from UTI Patient presented with lactic acid 4, fever, tachycardia and mild AKI.  CT imaging, symptoms/hematuria, and culture data suggest Proteus urinary infection. -Treated with IV antibiotics for 3 days (ceftriaxone).  -plan to discharge  on amoxicillin for 5 more days.  -he had foley place in the ED, only yield 20 cc. Will do voiding trial prior to discharge. He will need follow up with urology do to CT finding.    Recent asymptomatic SARS-CoV-2 infection now resolved Patient's caregiver tested positive a month ago, and patient was tested for Covid on or around November 11 (per sister-in-law)  due to that exposure.  He tested positive, but never had symptoms.  He was in isolation for 14 days, ending November 25 sister-in-law is quite sure.  She does not have documentation of this test result.   -If documentation of the initial positive test can be obtained, the patient can discontinue isolation  TBI with resulting cognitive impairment  Hypokalemia Resolved. discharge home on kcl su[pplement.   Hyperlipidemia -Continue atorvastatin  Elevated troponin No chest pain headache time, suspect this is an incidental finding, the level is extremely low, and I do not feel ischemic work-up is warranted.  Will repeat tomorrow morning to confirm that this has peaked. ECHO with low normal EF, questionable wall motion abnormalities, poor window.  Needs repeat ECHO. I have arrange follow up with cardiologist.   Acute blood loss anemia Hemoglobin dropped from 14-12, now stable.  There is an insignificant drop, likely from hematuria.  FOBT was negative.  Do not suspect GI blood loss.  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  Direct bilirubin normal, ultrasound of the abdomen unremarkable.  Blood cultures 1 out of 4 blood culture bottles positive for coag negative staph, likely contaminant    Discharge Diagnoses:  Principal Problem:  Sepsis (HCC) Active Problems:   Acute lower UTI   COVID-19 virus infection    Discharge Instructions  Discharge Instructions    Diet - low sodium heart healthy   Complete by: As directed    Increase activity slowly   Complete by: As directed     MyChart COVID-19 home monitoring program   Complete by: Jan 30, 2019    Is the patient willing to use the MyChart Mobile App for home monitoring?: No     Allergies as of 01/30/2019   No Known Allergies     Medication List    STOP taking these medications   naproxen sodium 220 MG tablet Commonly known as: ALEVE     TAKE these medications   ampicillin 500 MG capsule Commonly known as: PRINCIPEN Take 1 capsule (500 mg total) by mouth every 8 (eight) hours for 5 days.   atorvastatin 40 MG tablet Commonly known as: LIPITOR Take 40 mg by mouth at bedtime.   multivitamin with minerals Tabs tablet Take 1 tablet by mouth daily.   potassium chloride 10 MEQ tablet Commonly known as: KLOR-CON Take 1 tablet (10 mEq total) by mouth daily.   Vitamin D-3 125 MCG (5000 UT) Tabs Take 5,000 Units by mouth daily.      Follow-up Information    Laurann Montana, MD Follow up in 1 week(s).   Specialty: Family Medicine Contact information: [phone] [address] [place] [address] [phone]          No Known Allergies   none   Procedures/Studies: Ct Abdomen Pelvis W Contrast  Result Date: 01/27/2019 CLINICAL DATA:  Abdominal distention. EXAM: CT ABDOMEN AND PELVIS WITH CONTRAST TECHNIQUE: Multidetector CT imaging of the abdomen and pelvis was performed using the standard protocol following bolus administration of intravenous contrast. CONTRAST:  OMNIPAQUE IOHEXOL 300 MG/ML  SOLN COMPARISON:  None. FINDINGS: Lower chest: Lung bases are clear. No effusions. Heart is normal size. Hepatobiliary: No focal hepatic abnormality. Gallbladder unremarkable. Pancreas: No focal abnormality or ductal dilatation. Spleen: No focal abnormality.  Normal size. Adrenals/Urinary Tract: No hydronephrosis. No renal or adrenal mass. Urinary bladder appears thick walled, decompressed. Stomach/Bowel: Normal appendix. Stomach, large and small bowel grossly unremarkable. Vascular/Lymphatic:  Aortic atherosclerosis. No enlarged abdominal or pelvic lymph nodes. Reproductive: Prostate enlargement. Other: No free fluid or free air. Musculoskeletal: No acute bony abnormality. IMPRESSION: Thickened bladder wall. This could be related to bladder outlet obstruction with the enlarged prostate. This could also represent cystitis. Recommend clinical correlation. Aortic atherosclerosis. Electronically Signed   By: Charlett Nose M.D.   On: 01/27/2019 20:42   US Abdomen Limited  Result Date: 01/28/2019 CLINICAL DATA:  Elevated liver enzymes EXAM: ULTRASOUND ABDOMEN LIMITED RIGHT UPPER QUADRANT COMPARISON:  CT abdomen and pelvis January 27, 2019. FINDINGS: Gallbladder: No gallstones or wall thickening visualized. There is no pericholecystic fluid. No sonographic Murphy sign noted by sonographer. Common bile duct: Diameter: 4 mm. No intrahepatic or extrahepatic biliary duct dilatation. Liver: No focal lesion identified. Within normal limits in parenchymal echogenicity. Portal vein is patent on color Doppler imaging with normal direction of blood flow towards the liver. Other: None. IMPRESSION: Study within normal limits. Electronically Signed   By: Bretta Bang III M.D.   On: 01/28/2019 11:18   Dg Chest Port 1 View  Result Date: 01/27/2019 CLINICAL DATA:  Patient diagnosed with COVID-19 2 weeks ago. Confusion. EXAM: PORTABLE CHEST 1 VIEW COMPARISON:  None. FINDINGS: The heart size and mediastinal contours are  within normal limits. Both lungs are clear. The visualized skeletal structures are unremarkable. IMPRESSION: No active disease. Electronically Signed   By: Gerome Samavid  Williams III M.D   On: 01/27/2019 18:10     Subjective: Alert, repeating himself over and over, denies chest pain.   Discharge Exam: Vitals:   01/30/19 0400 01/30/19 0730  BP: 118/71 106/68  Pulse: 86 89  Resp:    Temp: 97.7 F (36.5 C) 98.9 F (37.2 C)  SpO2: 100% 99%     General: Pt is alert, awake, not in acute  distress Cardiovascular: RRR, S1/S2 +, no rubs, no gallops Respiratory: CTA bilaterally, no wheezing, no rhonchi Abdominal: Soft, NT, ND, bowel sounds + Extremities: no edema, no cyanosis    The results of significant diagnostics from this hospitalization (including imaging, microbiology, ancillary and laboratory) are listed below for reference.     Microbiology: Recent Results (from the past 240 hour(s))  Blood Culture (routine x 2)     Status: None (Preliminary result)   Collection Time: 01/27/19  7:04 PM   Specimen: BLOOD  Result Value Ref Range Status   Specimen Description BLOOD RIGHT ANTECUBITAL  Final   Special Requests   Final    BOTTLES DRAWN AEROBIC AND ANAEROBIC Blood Culture adequate volume   Culture   Final    NO GROWTH 3 DAYS Performed at [facility], [address] [place], [place] [address]    Report Status PENDING  Incomplete  Blood Culture (routine x 2)     Status: Abnormal   Collection Time: 01/27/19  7:30 PM   Specimen: BLOOD  Result Value Ref Range Status   Specimen Description BLOOD LEFT ANTECUBITAL  Final   Special Requests   Final    BOTTLES DRAWN AEROBIC AND ANAEROBIC Blood Culture results may not be optimal due to an inadequate volume of blood received in culture bottles   Culture  Setup Time   Final    ANAEROBIC BOTTLE ONLY GRAM POSITIVE COCCI CRITICAL RESULT CALLED TO, READ BACK BY AND VERIFIED WITH: Rogers SeedsM MACCAI [facility] 01/28/19 1741 JDW    Culture (A)  Final    STAPHYLOCOCCUS SPECIES (COAGULASE NEGATIVE) THE SIGNIFICANCE OF ISOLATING THIS ORGANISM FROM A SINGLE SET OF BLOOD CULTURES WHEN MULTIPLE SETS ARE DRAWN IS UNCERTAIN. PLEASE NOTIFY THE MICROBIOLOGY DEPARTMENT WITHIN ONE WEEK IF SPECIATION AND SENSITIVITIES ARE REQUIRED. Performed at [facility], [address] [place], [place] [address]    Report Status 01/30/2019 FINAL  Final  Urine culture     Status: Abnormal   Collection Time: 01/27/19  8:35 PM   Specimen: Urine, Random   Result Value Ref Range Status   Specimen Description URINE, RANDOM  Final   Special Requests   Final    NONE Performed at [facility], [address] [place], [place] [address]    Culture 80,000 COLONIES/mL PROTEUS MIRABILIS (A)  Final   Report Status 01/30/2019 FINAL  Final   Organism ID, Bacteria PROTEUS MIRABILIS (A)  Final      Susceptibility   Proteus mirabilis - MIC*    AMPICILLIN <=2 SENSITIVE Sensitive     CEFAZOLIN <=4 SENSITIVE Sensitive     CEFTRIAXONE <=1 SENSITIVE Sensitive     CIPROFLOXACIN <=0.25 SENSITIVE Sensitive     GENTAMICIN <=1 SENSITIVE Sensitive     IMIPENEM 4 SENSITIVE Sensitive     NITROFURANTOIN 128 RESISTANT Resistant     TRIMETH/SULFA <=20 SENSITIVE Sensitive     AMPICILLIN/SULBACTAM <=2 SENSITIVE  Sensitive     PIP/TAZO <=4 SENSITIVE Sensitive     * 80,000 COLONIES/mL PROTEUS MIRABILIS  SARS CORONAVIRUS 2 (TAT 6-24 HRS) Nasopharyngeal Nasopharyngeal Swab     Status: Abnormal   Collection Time: 01/27/19 11:53 PM   Specimen: Nasopharyngeal Swab  Result Value Ref Range Status   SARS Coronavirus 2 POSITIVE (A) NEGATIVE Final    Comment: RESULT CALLED TO, READ BACK BY AND VERIFIED WITH: RN N FOUNTAIN @0740  01/28/19 BY S GEZAHEGN (NOTE) SARS-CoV-2 target nucleic acids are DETECTED. The SARS-CoV-2 RNA is generally detectable in upper and lower respiratory specimens during the acute phase of infection. Positive results are indicative of the presence of SARS-CoV-2 RNA. Clinical correlation with patient history and other diagnostic information is  necessary to determine patient infection status. Positive results do not rule out bacterial infection or co-infection with other viruses.  The expected result is Negative. Fact Sheet for Patients: SugarRoll.be Fact Sheet for Healthcare Providers: https://www.woods-mathews.com/ This test is not yet approved or cleared by the [place] FDA and  has been  authorized for detection and/or diagnosis of SARS-CoV-2 by FDA under an Emergency Use Authorization (EUA). This EUA will remain  in effect (meaning this test can be used)  for the duration of the COVID-19 declaration under Section 564(b)(1) of the Act, 21 U.S.C. section 360bbb-3(b)(1), unless the authorization is terminated or revoked sooner. Performed at [facility], [place] [address], [place], [place] [address]      Labs: BNP (last 3 results) No results for input(s): BNP in the last 8760 hours. Basic Metabolic Panel: Recent Labs  Lab 01/27/19 1544 01/28/19 0730 01/29/19 0059 01/30/19 0414  NA 138 139 139 139  K 3.9 3.7 3.4* 3.6  CL 98 105 106 106  CO2 27 23 20* 22  GLUCOSE 119* 99 107* 108*  BUN 15 14 16 14   CREATININE 1.25* 1.19 1.13 1.13  CALCIUM 9.7 8.6* 8.4* 8.6*  MG  --   --   --  2.0   Liver Function Tests: Recent Labs  Lab 01/27/19 1904 01/28/19 0730 01/29/19 0059 01/30/19 0414  AST 21 25 30  33  ALT 23 22 24 26   ALKPHOS 106 101 89 97  BILITOT 2.9* 3.0* 2.1* 1.0  PROT 8.0 6.8 6.4* 6.9  ALBUMIN 3.1* 2.6* 2.5* 2.4*   No results for input(s): LIPASE, AMYLASE in the last 168 hours. No results for input(s): AMMONIA in the last 168 hours. CBC: Recent Labs  Lab 01/27/19 1544 01/28/19 0730 01/30/19 0414  WBC 19.8* 12.7* 8.0  HGB 14.0 12.1* 12.2*  HCT 43.0 37.8* 37.6*  MCV 89.6 90.9 88.5  PLT 285 206 182   Cardiac Enzymes: Recent Labs  Lab 01/28/19 0729  CKTOTAL 296  CKMB 3.7   BNP: Invalid input(s): POCBNP CBG: No results for input(s): GLUCAP in the last 168 hours. D-Dimer Recent Labs    01/28/19 0729  DDIMER 1.43*   Hgb A1c No results for input(s): HGBA1C in the last 72 hours. Lipid Profile No results for input(s): CHOL, HDL, LDLCALC, TRIG, CHOLHDL, LDLDIRECT in the last 72 hours. Thyroid function studies Recent Labs    01/28/19 0731  TSH 2.213   Anemia work up No results for input(s): VITAMINB12, FOLATE, FERRITIN, TIBC,  IRON, RETICCTPCT in the last 72 hours. Urinalysis    Component Value Date/Time   COLORURINE AMBER (A) 01/27/2019 2035   APPEARANCEUR CLOUDY (A) 01/27/2019 2035   LABSPEC >1.046 (H) 01/27/2019 2035   PHURINE 5.0 01/27/2019 2035  GLUCOSEU NEGATIVE 01/27/2019 2035   HGBUR LARGE (A) 01/27/2019 2035   BILIRUBINUR NEGATIVE 01/27/2019 2035   KETONESUR 20 (A) 01/27/2019 2035   PROTEINUR 100 (A) 01/27/2019 2035   NITRITE NEGATIVE 01/27/2019 2035   LEUKOCYTESUR SMALL (A) 01/27/2019 2035   Sepsis Labs Invalid input(s): PROCALCITONIN,  WBC,  LACTICIDVEN Microbiology Recent Results (from the past 240 hour(s))  Blood Culture (routine x 2)     Status: None (Preliminary result)   Collection Time: 01/27/19  7:04 PM   Specimen: BLOOD  Result Value Ref Range Status   Specimen Description BLOOD RIGHT ANTECUBITAL  Final   Special Requests   Final    BOTTLES DRAWN AEROBIC AND ANAEROBIC Blood Culture adequate volume   Culture   Final    NO GROWTH 3 DAYS Performed at [facility], [address], [place], [place] [address]    Report Status PENDING  Incomplete  Blood Culture (routine x 2)     Status: Abnormal   Collection Time: 01/27/19  7:30 PM   Specimen: BLOOD  Result Value Ref Range Status   Specimen Description BLOOD LEFT ANTECUBITAL  Final   Special Requests   Final    BOTTLES DRAWN AEROBIC AND ANAEROBIC Blood Culture results may not be optimal due to an inadequate volume of blood received in culture bottles   Culture  Setup Time   Final    ANAEROBIC BOTTLE ONLY GRAM POSITIVE COCCI CRITICAL RESULT CALLED TO, READ BACK BY AND VERIFIED WITH: Rogers Seeds [facility] 01/28/19 1741 JDW    Culture (A)  Final    STAPHYLOCOCCUS SPECIES (COAGULASE NEGATIVE) THE SIGNIFICANCE OF ISOLATING THIS ORGANISM FROM A SINGLE SET OF BLOOD CULTURES WHEN MULTIPLE SETS ARE DRAWN IS UNCERTAIN. PLEASE NOTIFY THE MICROBIOLOGY DEPARTMENT WITHIN ONE WEEK IF SPECIATION AND SENSITIVITIES ARE REQUIRED. Performed at [facility], [address], [place], [place] [address]    Report Status 01/30/2019 FINAL  Final  Urine culture     Status: Abnormal   Collection Time: 01/27/19  8:35 PM   Specimen: Urine, Random  Result Value Ref Range Status   Specimen Description URINE, RANDOM  Final   Special Requests   Final    NONE Performed at [facility], [address] [place], [place] [address]    Culture 80,000 COLONIES/mL PROTEUS MIRABILIS (A)  Final   Report Status 01/30/2019 FINAL  Final   Organism ID, Bacteria PROTEUS MIRABILIS (A)  Final      Susceptibility   Proteus mirabilis - MIC*    AMPICILLIN <=2 SENSITIVE Sensitive     CEFAZOLIN <=4 SENSITIVE Sensitive     CEFTRIAXONE <=1 SENSITIVE Sensitive     CIPROFLOXACIN <=0.25 SENSITIVE Sensitive     GENTAMICIN <=1 SENSITIVE Sensitive     IMIPENEM 4 SENSITIVE Sensitive     NITROFURANTOIN 128 RESISTANT Resistant     TRIMETH/SULFA <=20 SENSITIVE Sensitive     AMPICILLIN/SULBACTAM <=2 SENSITIVE Sensitive     PIP/TAZO <=4 SENSITIVE Sensitive     * 80,000 COLONIES/mL PROTEUS MIRABILIS  SARS CORONAVIRUS 2 (TAT 6-24 HRS) Nasopharyngeal Nasopharyngeal Swab     Status: Abnormal   Collection Time: 01/27/19 11:53 PM   Specimen: Nasopharyngeal Swab  Result Value Ref Range Status   SARS Coronavirus 2 POSITIVE (A) NEGATIVE Final    Comment: RESULT CALLED TO, READ BACK BY AND VERIFIED WITH: RN N FOUNTAIN @0740  01/28/19 BY S GEZAHEGN (NOTE) SARS-CoV-2 target nucleic acids are DETECTED. The SARS-CoV-2 RNA is generally detectable  in upper and lower respiratory specimens during the acute phase of infection. Positive results are indicative of the presence of SARS-CoV-2 RNA. Clinical correlation with patient history and other diagnostic information is  necessary to determine patient infection status. Positive results do not rule out bacterial infection or co-infection with other viruses.  The expected result is Negative. Fact Sheet for  Patients: HairSlick.no Fact Sheet for Healthcare Providers: quierodirigir.com This test is not yet approved or cleared by the [place] FDA and  has been authorized for detection and/or diagnosis of SARS-CoV-2 by FDA under an Emergency Use Authorization (EUA). This EUA will remain  in effect (meaning this test can be used)  for the duration of the COVID-19 declaration under Section 564(b)(1) of the Act, 21 U.S.C. section 360bbb-3(b)(1), unless the authorization is terminated or revoked sooner. Performed at [facility], [address] [place], [place] [address]      Time coordinating discharge: 40 minutes  SIGNED:   Alba Cory, MD  [facility]

## 2019-02-01 DIAGNOSIS — K648 Other hemorrhoids: Secondary | ICD-10-CM | POA: Diagnosis not present

## 2019-02-01 DIAGNOSIS — R3129 Other microscopic hematuria: Secondary | ICD-10-CM | POA: Diagnosis not present

## 2019-02-01 LAB — CULTURE, BLOOD (ROUTINE X 2)
Culture: NO GROWTH
Special Requests: ADEQUATE

## 2019-02-05 DIAGNOSIS — D72829 Elevated white blood cell count, unspecified: Secondary | ICD-10-CM | POA: Diagnosis not present

## 2019-02-05 DIAGNOSIS — R195 Other fecal abnormalities: Secondary | ICD-10-CM | POA: Diagnosis not present

## 2019-02-05 DIAGNOSIS — N289 Disorder of kidney and ureter, unspecified: Secondary | ICD-10-CM | POA: Diagnosis not present

## 2019-02-05 DIAGNOSIS — R931 Abnormal findings on diagnostic imaging of heart and coronary circulation: Secondary | ICD-10-CM | POA: Diagnosis not present

## 2019-02-05 DIAGNOSIS — R319 Hematuria, unspecified: Secondary | ICD-10-CM | POA: Diagnosis not present

## 2019-02-05 DIAGNOSIS — N390 Urinary tract infection, site not specified: Secondary | ICD-10-CM | POA: Diagnosis not present

## 2019-02-15 DIAGNOSIS — G931 Anoxic brain damage, not elsewhere classified: Secondary | ICD-10-CM | POA: Diagnosis not present

## 2019-02-15 DIAGNOSIS — E785 Hyperlipidemia, unspecified: Secondary | ICD-10-CM | POA: Diagnosis not present

## 2019-02-15 DIAGNOSIS — I2510 Atherosclerotic heart disease of native coronary artery without angina pectoris: Secondary | ICD-10-CM | POA: Diagnosis not present

## 2019-03-14 DIAGNOSIS — R195 Other fecal abnormalities: Secondary | ICD-10-CM | POA: Diagnosis not present

## 2019-03-15 DIAGNOSIS — R079 Chest pain, unspecified: Secondary | ICD-10-CM | POA: Diagnosis not present

## 2019-03-15 DIAGNOSIS — R195 Other fecal abnormalities: Secondary | ICD-10-CM | POA: Diagnosis not present

## 2019-03-21 DIAGNOSIS — N3000 Acute cystitis without hematuria: Secondary | ICD-10-CM | POA: Diagnosis not present

## 2019-03-21 DIAGNOSIS — Z125 Encounter for screening for malignant neoplasm of prostate: Secondary | ICD-10-CM | POA: Diagnosis not present

## 2019-04-25 ENCOUNTER — Ambulatory Visit: Payer: Medicare HMO | Attending: Internal Medicine

## 2019-04-25 DIAGNOSIS — Z23 Encounter for immunization: Secondary | ICD-10-CM | POA: Insufficient documentation

## 2019-04-25 NOTE — Progress Notes (Signed)
   Covid-19 Vaccination Clinic  Name:  Bruce Gonzales    MRN: [id number] DOB: [birth year]  04/25/2019  [name] was observed post Covid-19 immunization for 15 minutes without incidence. He was provided with Vaccine Information Sheet and instruction to access the V-Safe system.   [name] was instructed to call 911 with any severe reactions post vaccine: Marland Kitchen Difficulty breathing  . Swelling of your face and throat  . A fast heartbeat  . A bad rash all over your body  . Dizziness and weakness    Immunizations Administered    Name Date Dose VIS Date Route   Pfizer COVID-19 Vaccine 04/25/2019 11:00 AM 0.3 mL 02/08/2019 Intramuscular   Manufacturer: [employer], [employer]   Lot: [id number]   NDC: [address]-1

## 2019-05-03 ENCOUNTER — Other Ambulatory Visit: Payer: Self-pay | Admitting: Gastroenterology

## 2019-05-14 ENCOUNTER — Other Ambulatory Visit: Payer: Self-pay

## 2019-05-14 ENCOUNTER — Encounter (HOSPITAL_COMMUNITY): Payer: Self-pay | Admitting: Internal Medicine

## 2019-05-14 ENCOUNTER — Observation Stay (HOSPITAL_COMMUNITY)
Admission: RE | Admit: 2019-05-14 | Discharge: 2019-05-15 | Disposition: A | Discharge status: Home / Self Care | Payer: Medicare HMO | Attending: Family Medicine | Admitting: Family Medicine

## 2019-05-14 DIAGNOSIS — Z20822 Contact with and (suspected) exposure to covid-19: Secondary | ICD-10-CM | POA: Diagnosis not present

## 2019-05-14 DIAGNOSIS — Z87820 Personal history of traumatic brain injury: Secondary | ICD-10-CM | POA: Diagnosis not present

## 2019-05-14 DIAGNOSIS — R195 Other fecal abnormalities: Secondary | ICD-10-CM | POA: Diagnosis not present

## 2019-05-14 DIAGNOSIS — E559 Vitamin D deficiency, unspecified: Secondary | ICD-10-CM | POA: Diagnosis not present

## 2019-05-14 DIAGNOSIS — K625 Hemorrhage of anus and rectum: Secondary | ICD-10-CM | POA: Diagnosis not present

## 2019-05-14 DIAGNOSIS — I10 Essential (primary) hypertension: Secondary | ICD-10-CM

## 2019-05-14 DIAGNOSIS — S069X9A Unspecified intracranial injury with loss of consciousness of unspecified duration, initial encounter: Secondary | ICD-10-CM

## 2019-05-14 DIAGNOSIS — S069X9S Unspecified intracranial injury with loss of consciousness of unspecified duration, sequela: Secondary | ICD-10-CM | POA: Diagnosis not present

## 2019-05-14 DIAGNOSIS — E785 Hyperlipidemia, unspecified: Secondary | ICD-10-CM

## 2019-05-14 DIAGNOSIS — Z79899 Other long term (current) drug therapy: Secondary | ICD-10-CM | POA: Insufficient documentation

## 2019-05-14 DIAGNOSIS — K922 Gastrointestinal hemorrhage, unspecified: Secondary | ICD-10-CM | POA: Diagnosis present

## 2019-05-14 DIAGNOSIS — Z8616 Personal history of COVID-19: Secondary | ICD-10-CM

## 2019-05-14 DIAGNOSIS — S069XAA Unspecified intracranial injury with loss of consciousness status unknown, initial encounter: Secondary | ICD-10-CM

## 2019-05-14 DIAGNOSIS — K640 First degree hemorrhoids: Secondary | ICD-10-CM | POA: Insufficient documentation

## 2019-05-14 DIAGNOSIS — K449 Diaphragmatic hernia without obstruction or gangrene: Secondary | ICD-10-CM | POA: Diagnosis not present

## 2019-05-14 LAB — BASIC METABOLIC PANEL
Anion gap: 8 (ref 5–15)
BUN: 17 mg/dL (ref 8–23)
CO2: 26 mmol/L (ref 22–32)
Calcium: 9.3 mg/dL (ref 8.9–10.3)
Chloride: 103 mmol/L (ref 98–111)
Creatinine, Ser: 1.01 mg/dL (ref 0.61–1.24)
GFR calc Af Amer: >60 mL/min (ref >60)
GFR calc non Af Amer: >60 mL/min (ref >60)
Glucose, Bld: 97 mg/dL (ref 70–99)
Potassium: 4.1 mmol/L (ref 3.5–5.1)
Sodium: 137 mmol/L (ref 135–145)

## 2019-05-14 LAB — CBC
HCT: 46.2 % (ref 39.0–52.0)
Hemoglobin: 14.5 g/dL (ref 13.0–17.0)
MCH: 29.2 pg (ref 26.0–34.0)
MCHC: 31.4 g/dL (ref 30.0–36.0)
MCV: 93 fL (ref 80.0–100.0)
Platelets: 248 10*3/uL (ref 150–400)
RBC: 4.97 MIL/uL (ref 4.22–5.81)
RDW: 14 % (ref 11.5–15.5)
WBC: 5.8 10*3/uL (ref 4.0–10.5)
nRBC: 0 % (ref 0.0–0.2)

## 2019-05-14 LAB — SARS CORONAVIRUS 2 (TAT 6-24 HRS): SARS Coronavirus 2: NEGATIVE

## 2019-05-14 MED ORDER — FLEET ENEMA 7-19 GM/118ML RE ENEM
1.0000 | ENEMA | Freq: Once | RECTAL | Status: DC
  Filled 2019-05-14: qty 1

## 2019-05-14 MED ORDER — CARVEDILOL 3.125 MG PO TABS
3.1250 mg | ORAL_TABLET | Freq: Two times a day (BID) | ORAL | Status: DC
  Administered 2019-05-14: 3.125 mg via ORAL
  Filled 2019-05-14: qty 1

## 2019-05-14 MED ORDER — SODIUM CHLORIDE 0.9% FLUSH
3.0000 mL | Freq: Two times a day (BID) | INTRAVENOUS | Status: DC
  Administered 2019-05-14 – 2019-05-15 (×3): 3 mL via INTRAVENOUS

## 2019-05-14 MED ORDER — ATORVASTATIN CALCIUM 40 MG PO TABS
40.0000 mg | ORAL_TABLET | Freq: Every day | ORAL | Status: DC
  Administered 2019-05-14: 22:00:00 40 mg via ORAL
  Filled 2019-05-14: qty 1

## 2019-05-14 MED ORDER — VITAMIN D3 25 MCG (1000 UNIT) PO TABS
5000.0000 [IU] | ORAL_TABLET | Freq: Every day | ORAL | Status: DC
  Administered 2019-05-14: 5000 [IU] via ORAL
  Filled 2019-05-14: qty 5

## 2019-05-14 MED ORDER — SODIUM CHLORIDE 0.9% FLUSH: 3.0000 mL | INTRAVENOUS | Status: DC | PRN

## 2019-05-14 MED ORDER — SODIUM CHLORIDE 0.9 % IV SOLN: 250.0000 mL | INTRAVENOUS | Status: DC | PRN

## 2019-05-14 MED ORDER — ADULT MULTIVITAMIN W/MINERALS CH
1.0000 | ORAL_TABLET | Freq: Every day | ORAL | Status: DC
  Administered 2019-05-14: 14:00:00 1 via ORAL
  Filled 2019-05-14: qty 1

## 2019-05-14 MED ORDER — SODIUM CHLORIDE 0.9 % IV SOLN: INTRAVENOUS | Status: DC

## 2019-05-14 MED ORDER — POLYETHYLENE GLYCOL 3350 17 GM/SCOOP PO POWD
1.0000 | Freq: Once | ORAL | Status: AC
  Administered 2019-05-14: 255 g via ORAL
  Filled 2019-05-14: qty 255

## 2019-05-14 MED ORDER — POLYETHYLENE GLYCOL 3350 17 G PO PACK: 17.0000 g | PACK | Freq: Every day | ORAL | Status: DC | PRN

## 2019-05-14 MED ORDER — ACETAMINOPHEN 650 MG RE SUPP: 650.0000 mg | Freq: Four times a day (QID) | RECTAL | Status: DC | PRN

## 2019-05-14 MED ORDER — ACETAMINOPHEN 325 MG PO TABS: 650.0000 mg | ORAL_TABLET | Freq: Four times a day (QID) | ORAL | Status: DC | PRN

## 2019-05-14 NOTE — H&P (View-Only) (Signed)
Referring Provider: Dr. Lajuana Ripple Primary Care Physician:  Laurann Montana, MD Primary Gastroenterologist:  Dr. Bosie Clos  Reason for Consultation:  Positive FIT test  HPI: Bruce Gonzales is a 64 y.o. male with history of COVID-19 last November that was medically managed. He had rectal bleeding during hospitalization at that time. Positive FIT test following discharge without any further rectal bleeding. Hgb 14.3 on 03/15/19. His wife denies any rectal bleeding or diarrhea. 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 He was admitted for inpatient colonoscopy due to inability to drink the prep at home. Spoke to wife by phone who is not allowed to visit until his admit COVID test result returns and is negative.  Past Medical History:  Diagnosis Date  . COVID-19   . Hyperlipidemia   . TBI (traumatic brain injury) (HCC)     No past surgical history on file.  Prior to Admission medications   Medication Sig Start Date End Date Taking? Authorizing Provider  atorvastatin (LIPITOR) 40 MG tablet Take 40 mg by mouth at bedtime.  09/26/18  Yes [provider]  carvedilol (COREG) 3.125 MG tablet Take 3.125 mg by mouth 2 (two) times daily. 04/13/19  Yes [provider]  Cholecalciferol (VITAMIN D-3) 125 MCG (5000 UT) TABS Take 5,000 Units by mouth daily.   Yes [provider]  Multiple Vitamin (MULTIVITAMIN WITH MINERALS) TABS tablet Take 1 tablet by mouth daily at 2 PM.    Yes [provider]  naproxen sodium (ALEVE) 220 MG tablet Take 440 mg by mouth daily as needed (pain.).   Yes [provider]    Scheduled Meds: . atorvastatin  40 mg Oral QHS  . carvedilol  3.125 mg Oral BID  . multivitamin with minerals  1 tablet Oral Q1400  . sodium chloride flush  3 mL Intravenous Q12H  . Vitamin D-3  5,000 Units Oral Daily   Continuous Infusions: . sodium chloride     PRN Meds:.sodium  chloride, acetaminophen **OR** acetaminophen, polyethylene glycol, sodium chloride flush  Allergies as of 05/03/2019  . (No Known Allergies)    Family History  Problem Relation Age of Onset  . Leukemia Mother     Social History   Socioeconomic History  . Marital status: Single    Spouse name: Not on file  . Number of children: Not on file  . Years of education: Not on file  . Highest education level: Not on file  Occupational History  . Not on file  Tobacco Use  . Smoking status: Never Smoker  . Smokeless tobacco: Never Used  Substance and Sexual Activity  . Alcohol use: Not Currently  . Drug use: Not on file  . Sexual activity: Not on file  Other Topics Concern  . Not on file  Social History Narrative  . Not on file   Social Determinants of Health   Financial Resource Strain:   . Difficulty of Paying Living Expenses:   Food Insecurity:   . Worried About Programme researcher, broadcasting/film/video in the Last Year:   . Barista in the Last Year:   Transportation Needs:   . Freight forwarder (Medical):   Marland Kitchen Lack of Transportation (Non-Medical):   Physical Activity:   . Days of Exercise per Week:   . Minutes of Exercise per Session:   Stress:   . Feeling of Stress :   Social Connections:   .  Frequency of Communication with Friends and Family:   . Frequency of Social Gatherings with Friends and Family:   . Attends Religious Services:   . Active Member of Clubs or Organizations:   . Attends Club or Organization Meetings:   . Marital Status:   Intimate Partner Violence:   . Fear of Current or Ex-Partner:   . Emotionally Abused:   . Physically Abused:   . Sexually Abused:     Review of Systems: Unable to obtain Physical Exam: Vital signs: Vitals:   05/14/19 1055  BP: 130/78  Pulse: 82  Resp: 20  Temp: 98.7 F (37.1 C)  SpO2: 100%     General:  Lethargic, well-nourished, minimally verbal, no acute distress Eyes: anicteric sclera ENT: oropharynx clear Neck:  supple, nontender Lungs:  Clear throughout to auscultation.   No wheezes, crackles, or rhonchi. No acute distress. Heart:  Regular rate and rhythm; no murmurs, clicks, rubs,  or gallops. Abdomen: soft, nontender, nondistended, +BS  Rectal:  Deferred Ext: no edema  GI:  Lab Results: Recent Labs    05/14/19 1112  WBC 5.8  HGB 14.5  HCT 46.2  PLT 248   BMET Recent Labs    05/14/19 1112  NA 137  K 4.1  CL 103  CO2 26  GLUCOSE 97  BUN 17  CREATININE 1.01  CALCIUM 9.3   LFT No results for input(s): PROT, ALBUMIN, AST, ALT, ALKPHOS, BILITOT, BILIDIR, IBILI in the last 72 hours. PT/INR No results for input(s): LABPROT, INR in the last 72 hours.   Studies/Results: No results found.  Impression/Plan: 63 yo with positive FIT test in need of an inpatient colonoscopy if able to drink the colon prep. Will not be able to tolerate an NG placement without sedation. COVID test pending. Will give Miralax prep with Gatorade. Fleets enemas and see if he will drink Miralax prep. I do not think he will tolerate the NuLytely. EGD and colonoscopy planned for tomorrow with Propofol sedation. D/W Dr. Kamineni.    LOS: 0 days   Bruce Gonzales  05/14/2019, 11:53 AM  Questions please call [phone]  

## 2019-05-14 NOTE — Anesthesia Preprocedure Evaluation (Addendum)
Anesthesia Evaluation  Patient identified by MRN, date of birth, ID band Patient confused    Reviewed: Allergy & Precautions, NPO status , Patient's Chart, lab work & pertinent test results, reviewed documented beta blocker date and time , Unable to perform ROS - Chart review only  Airway Mallampati: II  TM Distance: >3 FB Neck ROM: Full    Dental  (+) Poor Dentition, Dental Advisory Given   Pulmonary  05/14/2019 SARS coronavirus NEG   Pulmonary exam normal breath sounds clear to auscultation       Cardiovascular hypertension, Pt. on home beta blockers (-) angina Rhythm:Regular Rate:Normal  '20 ECHO: EF 50-55%, ?postero-lateral hypokinesis, mild MS, mild TR   Neuro/Psych DementiaH/O TBI in 80's    GI/Hepatic Neg liver ROS, H/O GI bleed   Endo/Other  negative endocrine ROS  Renal/GU negative Renal ROS   H/o GI Bleed    Musculoskeletal   Abdominal   Peds  Hematology negative hematology ROS (+)   Anesthesia Other Findings   Reproductive/Obstetrics                            Anesthesia Physical Anesthesia Plan  ASA: III  Anesthesia Plan: MAC   Post-op Pain Management:    Induction:   PONV Risk Score and Plan: 1 and Treatment may vary due to age or medical condition  Airway Management Planned: Natural Airway and Simple Face Mask  Additional Equipment:   Intra-op Plan:   Post-operative Plan:   Informed Consent: I have reviewed the patients History and Physical, chart, labs and discussed the procedure including the risks, benefits and alternatives for the proposed anesthesia with the patient or authorized representative who has indicated his/her understanding and acceptance.     Dental advisory given and Consent reviewed with POA  Plan Discussed with: CRNA, Surgeon and Anesthesiologist  Anesthesia Plan Comments: (Discussed with patient's wife by telephone, consent obtained)       Anesthesia Quick Evaluation

## 2019-05-14 NOTE — Plan of Care (Signed)
  Problem: Clinical Measurements: Goal: Ability to maintain clinical measurements within normal limits will improve Outcome: Progressing Goal: Will remain free from infection Outcome: Progressing Goal: Diagnostic test results will improve Outcome: Progressing Goal: Respiratory complications will improve Outcome: Progressing Goal: Cardiovascular complication will be avoided Outcome: Progressing   Problem: Activity: Goal: Risk for activity intolerance will decrease Outcome: Progressing   Problem: Nutrition: Goal: Adequate nutrition will be maintained Outcome: Progressing   Problem: Coping: Goal: Level of anxiety will decrease Outcome: Progressing   Problem: Elimination: Goal: Will not experience complications related to bowel motility Outcome: Progressing Goal: Will not experience complications related to urinary retention Outcome: Progressing   Problem: Pain Managment: Goal: General experience of comfort will improve Outcome: Progressing   Problem: Safety: Goal: Ability to remain free from injury will improve Outcome: Progressing   Problem: Skin Integrity: Goal: Risk for impaired skin integrity will decrease Outcome: Progressing   Problem: Education: Goal: Knowledge of General Education information will improve Description: Including pain rating scale, medication(s)/side effects and non-pharmacologic comfort measures Outcome: Not Progressing   Problem: Health Behavior/Discharge Planning: Goal: Ability to manage health-related needs will improve Outcome: Not Progressing   

## 2019-05-14 NOTE — H&P (Signed)
History and Physical    DOA: 05/14/2019  PCP: Laurann Montana, MD  Patient coming from: home/GI clinic referral  Chief Complaint: Inability to tolerate oral prep for colonoscopy  HPI: Bruce Gonzales is a 64 y.o. male with history h/o hyperlipidemia, TBI with severe cognitive impairment who [facility]12[facility]2 for hematuria and associated Proteus urosepsis.  He also recovered from Covid infection in the second week of November 2020.  He was in isolation for 14 days.  Patient at some point was referred to outpatient GI for work-up of anemia, concern for active bleeding and planned for colonoscopy.  Of note patient's FOBT was negative in last admission.  He had a televisit with [facility], Dr. Bosie Clos today to discuss colonoscopy.  Due to TBI and advance cognitive impairment, wife expressed concern that he will not be able to tolerate oral prep at home.  GI requested admission for NG tube placement and bowel prep prior to colonoscopy in a.m.  Patient accepted for direct admission under observation status.   Review of Systems: As per HPI otherwise 10 point review of systems negative.    Past Medical History:  Diagnosis Date  . COVID-19   . Hyperlipidemia   . TBI (traumatic brain injury) (HCC)     No past surgical history on file.  Social history:  reports that he has never smoked. He has never used smokeless tobacco. He reports previous alcohol use. No history on file for drug.   No Known Allergies  Family History  Problem Relation Age of Onset  . Leukemia Mother       Prior to Admission medications   Medication Sig Start Date End Date Taking? Authorizing Provider  atorvastatin (LIPITOR) 40 MG tablet Take 40 mg by mouth at bedtime.  09/26/18  Yes [provider]  carvedilol (COREG) 3.125 MG tablet Take 3.125 mg by mouth 2 (two) times daily. 04/13/19  Yes [provider]  Cholecalciferol (VITAMIN D-3) 125 MCG (5000 UT)  TABS Take 5,000 Units by mouth daily.   Yes [provider]  Multiple Vitamin (MULTIVITAMIN WITH MINERALS) TABS tablet Take 1 tablet by mouth daily at 2 PM.    Yes [provider]  naproxen sodium (ALEVE) 220 MG tablet Take 440 mg by mouth daily as needed (pain.).   Yes [provider]    Physical Exam: Vitals:   05/14/19 1055 05/14/19 1423  BP: 130/78 131/68  Pulse: 82 72  Resp: 20 19  Temp: 98.7 F (37.1 C) (!) 97.5 F (36.4 C)  TempSrc: Oral Axillary  SpO2: 100% 100%    Constitutional: Pleasantly confused, calm, comfortable.  Wife at bedside Eyes: PERRL, lids and conjunctivae normal ENMT: Mucous membranes are moist. Posterior pharynx clear of any exudate or lesions.Normal dentition.  Neck: normal, supple, no masses, no thyromegaly Respiratory: clear to auscultation bilaterally, no wheezing, no crackles. Normal respiratory effort. No accessory muscle use.  Cardiovascular: Regular rate and rhythm, no murmurs / rubs / gallops. No extremity edema. 2+ pedal pulses. No carotid bruits.  Abdomen: no tenderness, no masses palpated. No hepatosplenomegaly. Bowel sounds positive.  Musculoskeletal: no clubbing / cyanosis. No joint deformity upper and lower extremities. Good ROM, no contractures. Normal muscle tone.  Neurologic: CN 2-12 grossly intact.  Moves all extremities spontaneously, cannot test sensations, pleasantly confused.   Psychiatric: Normal judgment and insight. Alert and pleasantly confused with cognitive impairment at baseline.  Normal mood.  SKIN/catheters: no rashes, lesions, ulcers. No induration  Labs on Admission: I have personally reviewed following labs and imaging studies  CBC: Recent Labs  Lab 05/14/19 1112  WBC 5.8  HGB 14.5  HCT 46.2  MCV 93.0  PLT 248   Basic Metabolic Panel: Recent Labs  Lab 05/14/19 1112  NA 137  K 4.1  CL 103  CO2 26  GLUCOSE 97  BUN 17  CREATININE 1.01  CALCIUM 9.3   GFR: CrCl cannot be  calculated (Unknown ideal weight.). Recent Labs  Lab 05/14/19 1112  WBC 5.8   Liver Function Tests: No results for input(s): AST, ALT, ALKPHOS, BILITOT, PROT, ALBUMIN in the last 168 hours. No results for input(s): LIPASE, AMYLASE in the last 168 hours. No results for input(s): AMMONIA in the last 168 hours. Coagulation Profile: No results for input(s): INR, PROTIME in the last 168 hours. Cardiac Enzymes: No results for input(s): CKTOTAL, CKMB, CKMBINDEX, TROPONINI in the last 168 hours. BNP (last 3 results) No results for input(s): PROBNP in the last 8760 hours. HbA1C: No results for input(s): HGBA1C in the last 72 hours. CBG: No results for input(s): GLUCAP in the last 168 hours. Lipid Profile: No results for input(s): CHOL, HDL, LDLCALC, TRIG, CHOLHDL, LDLDIRECT in the last 72 hours. Thyroid Function Tests: No results for input(s): TSH, T4TOTAL, FREET4, T3FREE, THYROIDAB in the last 72 hours. Anemia Panel: No results for input(s): VITAMINB12, FOLATE, FERRITIN, TIBC, IRON, RETICCTPCT in the last 72 hours. Urine analysis:    Component Value Date/Time   COLORURINE AMBER (A) 01/27/2019 2035   APPEARANCEUR CLOUDY (A) 01/27/2019 2035   LABSPEC >1.046 (H) 01/27/2019 2035   PHURINE 5.0 01/27/2019 2035   GLUCOSEU NEGATIVE 01/27/2019 2035   HGBUR LARGE (A) 01/27/2019 2035   BILIRUBINUR NEGATIVE 01/27/2019 2035   KETONESUR 20 (A) 01/27/2019 2035   PROTEINUR 100 (A) 01/27/2019 2035   NITRITE NEGATIVE 01/27/2019 2035   LEUKOCYTESUR SMALL (A) 01/27/2019 2035    Radiological Exams on Admission: Personally reviewed  No results found.      Assessment and Plan:   Principal Problem:   GIB (gastrointestinal bleeding) Active Problems:   TBI (traumatic brain injury) (HCC)   Hyperlipidemia   HTN (hypertension)   History of COVID-19    1.  Rectal bleeding: Hemoglobin did drop to 12 in last hospitalization, now improved to 14.5.  Might have been a dilutional effect previously.   Patient planned for colonoscopy by GI.  N.p.o. after midnight.  Initially admitted for NG tube placement/colon prep through NG tube but patient felt to have difficult anatomy while performing nasal swab to rule out Covid.  Subsequently NG tube was deferred by GI and prep changed to slow oral administration of MiraLAX along with Fleet enemas overnight.  Defer colon prep/further management to GI.  2.  Hypertension/hyperlipidemia: Resumed on home meds.    3. TBI/cognitive impairment: At baseline.  Resume home medications.  4.  Vitamin D deficiency: Resume home supplements.  DVT prophylaxis: SCDs  COVID screen: Pending  Code Status: Full code.Health care proxy would be his wife  Consults called: [facility] following Admission status :Patient will be admitted under OBSERVATION status.The patient's presenting symptoms, physical exam findings, and initial radiographic and laboratory data in the context of their medical condition is felt to place them at low risk for further clinical deterioration. Furthermore, it is anticipated that the patient will be medically stable for discharge from the hospital within 2 midnights of hospital stay.  Can be discharged home in a.m. after colonoscopy  Guilford Shi MD [facility] Pager in [place]  If 7PM-7AM, please contact night-coverage [email or web address]   05/14/2019, 6:33 PM

## 2019-05-14 NOTE — Consult Note (Addendum)
Referring Provider: Dr. Lajuana Ripple Primary Care Physician:  Laurann Montana, MD Primary Gastroenterologist:  Dr. Bosie Clos  Reason for Consultation:  Positive FIT test  HPI: Bruce Gonzales is a 64 y.o. male with history of COVID-19 last November that was medically managed. He had rectal bleeding during hospitalization at that time. Positive FIT test following discharge without any further rectal bleeding. Hgb 14.3 on 03/15/19. His wife denies any rectal bleeding or diarrhea. 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 He was admitted for inpatient colonoscopy due to inability to drink the prep at home. Spoke to wife by phone who is not allowed to visit until his admit COVID test result returns and is negative.  Past Medical History:  Diagnosis Date  . COVID-19   . Hyperlipidemia   . TBI (traumatic brain injury) (HCC)     No past surgical history on file.  Prior to Admission medications   Medication Sig Start Date End Date Taking? Authorizing Provider  atorvastatin (LIPITOR) 40 MG tablet Take 40 mg by mouth at bedtime.  09/26/18  Yes [provider]  carvedilol (COREG) 3.125 MG tablet Take 3.125 mg by mouth 2 (two) times daily. 04/13/19  Yes [provider]  Cholecalciferol (VITAMIN D-3) 125 MCG (5000 UT) TABS Take 5,000 Units by mouth daily.   Yes [provider]  Multiple Vitamin (MULTIVITAMIN WITH MINERALS) TABS tablet Take 1 tablet by mouth daily at 2 PM.    Yes [provider]  naproxen sodium (ALEVE) 220 MG tablet Take 440 mg by mouth daily as needed (pain.).   Yes [provider]    Scheduled Meds: . atorvastatin  40 mg Oral QHS  . carvedilol  3.125 mg Oral BID  . multivitamin with minerals  1 tablet Oral Q1400  . sodium chloride flush  3 mL Intravenous Q12H  . Vitamin D-3  5,000 Units Oral Daily   Continuous Infusions: . sodium chloride     PRN Meds:.sodium  chloride, acetaminophen **OR** acetaminophen, polyethylene glycol, sodium chloride flush  Allergies as of 05/03/2019  . (No Known Allergies)    Family History  Problem Relation Age of Onset  . Leukemia Mother     Social History   Socioeconomic History  . Marital status: Single    Spouse name: Not on file  . Number of children: Not on file  . Years of education: Not on file  . Highest education level: Not on file  Occupational History  . Not on file  Tobacco Use  . Smoking status: Never Smoker  . Smokeless tobacco: Never Used  Substance and Sexual Activity  . Alcohol use: Not Currently  . Drug use: Not on file  . Sexual activity: Not on file  Other Topics Concern  . Not on file  Social History Narrative  . Not on file   Social Determinants of Health   Financial Resource Strain:   . Difficulty of Paying Living Expenses:   Food Insecurity:   . Worried About Programme researcher, broadcasting/film/video in the Last Year:   . Barista in the Last Year:   Transportation Needs:   . Freight forwarder (Medical):   Marland Kitchen Lack of Transportation (Non-Medical):   Physical Activity:   . Days of Exercise per Week:   . Minutes of Exercise per Session:   Stress:   . Feeling of Stress :   Social Connections:   .  Frequency of Communication with Friends and Family:   . Frequency of Social Gatherings with Friends and Family:   . Attends Religious Services:   . Active Member of Clubs or Organizations:   . Attends Archivist Meetings:   Marland Kitchen Marital Status:   Intimate Partner Violence:   . Fear of Current or Ex-Partner:   . Emotionally Abused:   Marland Kitchen Physically Abused:   . Sexually Abused:     Review of Systems: Unable to obtain Physical Exam: Vital signs: Vitals:   05/14/19 1055  BP: 130/78  Pulse: 82  Resp: 20  Temp: 98.7 F (37.1 C)  SpO2: 100%     General:  Lethargic, well-nourished, minimally verbal, no acute distress Eyes: anicteric sclera ENT: oropharynx clear Neck:  supple, nontender Lungs:  Clear throughout to auscultation.   No wheezes, crackles, or rhonchi. No acute distress. Heart:  Regular rate and rhythm; no murmurs, clicks, rubs,  or gallops. Abdomen: soft, nontender, nondistended, +BS  Rectal:  Deferred Ext: no edema  GI:  Lab Results: Recent Labs    05/14/19 1112  WBC 5.8  HGB 14.5  HCT 46.2  PLT 248   BMET Recent Labs    05/14/19 1112  NA 137  K 4.1  CL 103  CO2 26  GLUCOSE 97  BUN 17  CREATININE 1.01  CALCIUM 9.3   LFT No results for input(s): PROT, ALBUMIN, AST, ALT, ALKPHOS, BILITOT, BILIDIR, IBILI in the last 72 hours. PT/INR No results for input(s): LABPROT, INR in the last 72 hours.   Studies/Results: No results found.  Impression/Plan: 64 yo with positive FIT test in need of an inpatient colonoscopy if able to drink the colon prep. Will not be able to tolerate an NG placement without sedation. COVID test pending. Will give Miralax prep with Gatorade. Fleets enemas and see if he will drink Miralax prep. I do not think he will tolerate the NuLytely. EGD and colonoscopy planned for tomorrow with Propofol sedation. D/W Dr. Earnest Conroy.    LOS: 0 days   [name]  05/14/2019, 11:53 AM  Questions please call [phone]

## 2019-05-15 ENCOUNTER — Observation Stay (HOSPITAL_COMMUNITY): Payer: Medicare HMO | Admitting: Anesthesiology

## 2019-05-15 ENCOUNTER — Encounter (HOSPITAL_COMMUNITY): Payer: Self-pay | Admitting: Internal Medicine

## 2019-05-15 ENCOUNTER — Encounter (HOSPITAL_COMMUNITY)
Admission: RE | Disposition: A | Discharge status: Home / Self Care | Payer: Self-pay | Source: Home / Self Care | Attending: Internal Medicine

## 2019-05-15 DIAGNOSIS — Z20822 Contact with and (suspected) exposure to covid-19: Secondary | ICD-10-CM | POA: Diagnosis not present

## 2019-05-15 DIAGNOSIS — K922 Gastrointestinal hemorrhage, unspecified: Secondary | ICD-10-CM

## 2019-05-15 DIAGNOSIS — S069X9A Unspecified intracranial injury with loss of consciousness of unspecified duration, initial encounter: Secondary | ICD-10-CM | POA: Diagnosis not present

## 2019-05-15 DIAGNOSIS — Z8616 Personal history of COVID-19: Secondary | ICD-10-CM | POA: Diagnosis not present

## 2019-05-15 DIAGNOSIS — K640 First degree hemorrhoids: Secondary | ICD-10-CM | POA: Diagnosis not present

## 2019-05-15 DIAGNOSIS — E785 Hyperlipidemia, unspecified: Secondary | ICD-10-CM | POA: Diagnosis not present

## 2019-05-15 DIAGNOSIS — I10 Essential (primary) hypertension: Secondary | ICD-10-CM | POA: Diagnosis not present

## 2019-05-15 DIAGNOSIS — K625 Hemorrhage of anus and rectum: Secondary | ICD-10-CM | POA: Diagnosis not present

## 2019-05-15 DIAGNOSIS — Z87820 Personal history of traumatic brain injury: Secondary | ICD-10-CM | POA: Diagnosis not present

## 2019-05-15 DIAGNOSIS — K449 Diaphragmatic hernia without obstruction or gangrene: Secondary | ICD-10-CM | POA: Diagnosis not present

## 2019-05-15 DIAGNOSIS — S069X9S Unspecified intracranial injury with loss of consciousness of unspecified duration, sequela: Secondary | ICD-10-CM

## 2019-05-15 DIAGNOSIS — R195 Other fecal abnormalities: Secondary | ICD-10-CM | POA: Diagnosis not present

## 2019-05-15 HISTORY — PX: COLONOSCOPY WITH PROPOFOL: SHX5780

## 2019-05-15 HISTORY — PX: ESOPHAGOGASTRODUODENOSCOPY (EGD) WITH PROPOFOL: SHX5813

## 2019-05-15 SURGERY — COLONOSCOPY WITH PROPOFOL
Anesthesia: Monitor Anesthesia Care

## 2019-05-15 MED ORDER — PHENYLEPHRINE HCL (PRESSORS) 10 MG/ML IV SOLN
INTRAVENOUS | Status: DC | PRN
  Administered 2019-05-15 (×3): 80 ug via INTRAVENOUS

## 2019-05-15 MED ORDER — PROPOFOL 10 MG/ML IV BOLUS
INTRAVENOUS | Status: AC
  Filled 2019-05-15: qty 20

## 2019-05-15 MED ORDER — PROPOFOL 500 MG/50ML IV EMUL
INTRAVENOUS | Status: AC
  Filled 2019-05-15: qty 50

## 2019-05-15 MED ORDER — LACTATED RINGERS IV SOLN
INTRAVENOUS | Status: DC
  Administered 2019-05-15: 10:00:00 1000 mL via INTRAVENOUS

## 2019-05-15 MED ORDER — PROPOFOL 500 MG/50ML IV EMUL
INTRAVENOUS | Status: DC | PRN
  Administered 2019-05-15: 30 mg via INTRAVENOUS
  Administered 2019-05-15 (×2): 20 mg via INTRAVENOUS

## 2019-05-15 MED ORDER — PROPOFOL 500 MG/50ML IV EMUL
INTRAVENOUS | Status: DC | PRN
  Administered 2019-05-15: 125 ug/kg/min via INTRAVENOUS

## 2019-05-15 MED ORDER — FLEET ENEMA 7-19 GM/118ML RE ENEM
ENEMA | RECTAL | Status: AC
  Filled 2019-05-15: qty 1

## 2019-05-15 SURGICAL SUPPLY — 25 items

## 2019-05-15 NOTE — Transfer of Care (Signed)
Immediate Anesthesia Transfer of Care Note  Patient: Bruce Gonzales  Procedure(s) Performed: COLONOSCOPY WITH PROPOFOL (N/A ) ESOPHAGOGASTRODUODENOSCOPY (EGD) WITH PROPOFOL (N/A )  Patient Location: PACU  Anesthesia Type:MAC  Level of Consciousness: drowsy, patient cooperative and responds to stimulation  Airway & Oxygen Therapy: Patient Spontanous Breathing and Patient connected to face mask oxygen  Post-op Assessment: Report given to RN and Post -op Vital signs reviewed and stable  Post vital signs: Reviewed and stable  Last Vitals:  Vitals Value Taken Time  BP    Temp    Pulse 91 05/15/19 1121  Resp 16 05/15/19 1121  SpO2 100 % 05/15/19 1121  Vitals shown include unvalidated device data.  Last Pain:  Vitals:   05/15/19 1006  TempSrc: Temporal         Complications: No apparent anesthesia complications

## 2019-05-15 NOTE — Progress Notes (Signed)
Attempted to contact pt's wife to obtain consent for procedure. Was unable to make contact, will continue reaching out for consent.

## 2019-05-15 NOTE — Discharge Summary (Signed)
Physician Discharge Summary  Bruce Gonzales XBD:[id number] DOB: 06-Aug-1955 DOA: 05/14/2019  PCP: Bruce Montana, MD  Admit date: 05/14/2019 Discharge date: 05/15/2019  Admitted From: Home Disposition: Home   Recommendations for Outpatient Follow-up:  1. Follow up with PCP in 1-2 weeks 2. Follow up with GI prn  Home Health: None new Equipment/Devices: None new Discharge Condition: Stable CODE STATUS: Full Diet recommendation: Heart healthy  Brief/Interim Summary: Bruce Gonzales is a 63 y.o. male with history h/o hyperlipidemia, TBI with severe cognitive impairment who was [facility] from 11/29-12/2 for hematuria and associated Proteus urosepsis.  He also recovered from Covid infection in the second week of November 2020.  He was in isolation for 14 days.  Patient at some point was referred to outpatient GI for work-up of anemia, concern for active bleeding and planned for colonoscopy.  Of note patient's FOBT was negative in last admission.  He had a televisit with [facility], Dr. Bosie Clos today to discuss colonoscopy.  Due to TBI and advance cognitive impairment, wife expressed concern that he will not be able to tolerate oral prep at home.  GI requested admission for bowel prep prior to colonoscopy. This was performed with EGD 3/17 and was largely unrevealing. The patient is discharged in stable condition.  Discharge Diagnoses:  Principal Problem:   GIB (gastrointestinal bleeding) Active Problems:   TBI (traumatic brain injury) (HCC)   Hyperlipidemia   HTN (hypertension)   History of COVID-19  Suspected GI bleeding: No anemia or active bleeding, work up here unrevealing, possibly due to hemorrhoids.  Hypertension/hyperlipidemia: Resumed on home meds.    TBI/cognitive impairment: At baseline.  Resume home medications.  Vitamin D deficiency: Resume home supplements.  Discharge Instructions Discharge Instructions    Diet - low sodium heart healthy    Complete by: As directed    Discharge instructions   Complete by: As directed    You had an EGD and colonoscopy performed for suspicion of GI bleeding. The results are unremarkable, as discussed by Dr. Bosie Clos, and there is no abnormality in your blood work. You are stable for discharge, to resume taking home medications as you were, and should present for medical advice if you experience bleeding.   Increase activity slowly   Complete by: As directed      Allergies as of 05/15/2019   No Known Allergies     Medication List    TAKE these medications   atorvastatin 40 MG tablet Commonly known as: LIPITOR Take 40 mg by mouth at bedtime.   carvedilol 3.125 MG tablet Commonly known as: COREG Take 3.125 mg by mouth 2 (two) times daily.   multivitamin with minerals Tabs tablet Take 1 tablet by mouth daily at 2 PM.   naproxen sodium 220 MG tablet Commonly known as: ALEVE Take 440 mg by mouth daily as needed (pain.).   Vitamin D-3 125 MCG (5000 UT) Tabs Take 5,000 Units by mouth daily.      Follow-up Information    Bruce Montana, MD. Schedule an appointment as soon as possible for a visit.   Specialty: Family Medicine Contact information: [phone] [address] [place] [address] [phone]          No Known Allergies  Consultations:  [facility], Dr. Bosie Clos  Procedures/Studies: EGD: Findings:      The examined esophagus was normal.      The Z-line was regular and was found 42 cm from the incisors.  A small hiatal hernia was present.      The exam of the stomach was otherwise normal.      The examined duodenum was normal.      The cardia and gastric fundus were normal on retroflexion. Impression:       - Normal esophagus.                           - Z-line regular, 42 cm from the incisors.                           - Small hiatal hernia.                           - Normal examined duodenum.                           - No specimens  collected.  COLONOSCOPY:  Findings:      The perianal and digital rectal examinations were normal.      Internal hemorrhoids were found during retroflexion. The hemorrhoids       were medium-sized and Grade I (internal hemorrhoids that do not       prolapse).      The exam was otherwise normal throughout the examined colon.      The terminal ileum appeared normal. Impression:       - Preparation of the colon was fair.                           - Internal hemorrhoids.                           - The examined portion of the ileum was normal.                           - No specimens collected.  Subjective: No complaints this morning. Tolerated prep fairly.  Discharge Exam: Vitals:   05/15/19 1130 05/15/19 1140  BP: 135/70 (!) 124/104  Pulse: 85 75  Resp: 16 15  Temp:    SpO2: 98% 96%   General: Pt is alert, awake, not in acute distress Cardiovascular: RRR, S1/S2 +, no rubs, no gallops Respiratory: CTA bilaterally, no wheezing, no rhonchi Abdominal: Soft, NT, ND, bowel sounds + Extremities: No edema, no cyanosis  Labs: Basic Metabolic Panel: Recent Labs  Lab 05/14/19 1112  NA 137  K 4.1  CL 103  CO2 26  GLUCOSE 97  BUN 17  CREATININE 1.01  CALCIUM 9.3   CBC: Recent Labs  Lab 05/14/19 1112  WBC 5.8  HGB 14.5  HCT 46.2  MCV 93.0  PLT 248   Microbiology Recent Results (from the past 240 hour(s))  SARS CORONAVIRUS 2 (TAT 6-24 HRS) Nasopharyngeal Nasopharyngeal Swab     Status: None   Collection Time: 05/14/19 10:45 AM   Specimen: Nasopharyngeal Swab  Result Value Ref Range Status   SARS Coronavirus 2 NEGATIVE NEGATIVE Final    Comment: (NOTE) SARS-CoV-2 target nucleic acids are NOT DETECTED. The SARS-CoV-2 RNA is generally detectable in upper and lower respiratory specimens during the acute phase of infection. Negative results do not preclude SARS-CoV-2 infection, do not rule out co-infections with other pathogens, and should not  be used as the sole basis  for treatment or other patient management decisions. Negative results must be combined with clinical observations, patient history, and epidemiological information. The expected result is Negative. Fact Sheet for Patients: SugarRoll.be Fact Sheet for Healthcare Providers: https://www.woods-mathews.com/ This test is not yet approved or cleared by the [place] FDA and  has been authorized for detection and/or diagnosis of SARS-CoV-2 by FDA under an Emergency Use Authorization (EUA). This EUA will remain  in effect (meaning this test can be used) for the duration of the COVID-19 declaration under Section 56 4(b)(1) of the Act, 21 U.S.C. section 360bbb-3(b)(1), unless the authorization is terminated or revoked sooner. Performed at [facility], [place] [address], [place], [place] [address]     Time coordinating discharge: Approximately 40 minutes  Patrecia Pour, MD  [facility] 05/15/2019, 12:42 PM

## 2019-05-15 NOTE — Op Note (Signed)
[facility] Patient Name: Bruce Gonzales Procedure Date: 05/15/2019 MRN: [id number] Attending MD: [name] , MD Date of Birth: [birth year] CSN: [id number] Age: 64 Admit Type: Inpatient Procedure:                Colonoscopy Indications:              This is the patient's first colonoscopy, Positive                            fecal immunochemical test Providers:                [name], MD, Cleda Daub, RN,                            William Dalton, Technician Referring MD:             Harlan Stains Medicines:                Propofol per Anesthesia, Monitored Anesthesia Care Complications:            No immediate complications. Estimated Blood Loss:     Estimated blood loss: none. Procedure:                Pre-Anesthesia Assessment:                           - Prior to the procedure, a History and Physical                            was performed, and patient medications and                            allergies were reviewed. The patient's tolerance of                            previous anesthesia was also reviewed. The risks                            and benefits of the procedure and the sedation                            options and risks were discussed with the patient.                            All questions were answered, and informed consent                            was obtained. Prior Anticoagulants: The patient has                            taken no previous anticoagulant or antiplatelet                            agents. ASA Grade Assessment: III - A patient with  severe systemic disease. After reviewing the risks                            and benefits, the patient was deemed in                            satisfactory condition to undergo the procedure.                           After obtaining informed consent, the colonoscope                            was passed under direct vision. Throughout the                       procedure, the patient's blood pressure, pulse, and                            oxygen saturations were monitored continuously. The                            PCF-H190DL ([id number] Olympus pediatric colonscope                            was introduced through the anus and advanced to the                            the cecum, identified by appendiceal orifice and                            ileocecal valve. The colonoscopy was somewhat                            difficult due to fair prep. Successful completion                            of the procedure was aided by straightening and                            shortening the scope to obtain bowel loop reduction                            and lavage. The patient tolerated the procedure                            well. The quality of the bowel preparation was                            fair. The terminal ileum, ileocecal valve,                            appendiceal orifice, and rectum were photographed. Scope In: 10:49:06 AM Scope Out: 11:07:03 AM Scope Withdrawal Time: 0 hours 13 minutes 54 seconds  Total Procedure Duration: 0 hours 17  minutes 57 seconds  Findings:      The perianal and digital rectal examinations were normal.      Internal hemorrhoids were found during retroflexion. The hemorrhoids       were medium-sized and Grade I (internal hemorrhoids that do not       prolapse).      The exam was otherwise normal throughout the examined colon.      The terminal ileum appeared normal. Impression:               - Preparation of the colon was fair.                           - Internal hemorrhoids.                           - The examined portion of the ileum was normal.                           - No specimens collected. Moderate Sedation:      Not Applicable - Patient had care per Anesthesia. Recommendation:           - Resume regular diet.                           - Repeat colonoscopy is not recommended for                             screening purposes.                           - Continue present medications. Procedure Code(s):        --- Professional ---                           [phone], Colonoscopy, flexible; diagnostic, including                            collection of specimen(s) by brushing or washing,                            when performed (separate procedure) Diagnosis Code(s):        --- Professional ---                           R19.5, Other fecal abnormalities                           K64.0, First degree hemorrhoids CPT copyright 2019 American [employer] Association. All rights reserved. The codes documented in this report are preliminary and upon coder review may  be revised to meet current compliance requirements. Shirley Friar, MD 05/15/2019 11:19:18 AM This report has been signed electronically. Number of Addenda: 0

## 2019-05-15 NOTE — Op Note (Signed)
[facility] Patient Name: Bruce Gonzales Procedure Date: 05/15/2019 MRN: [id number] Attending MD: Shirley Friar , MD Date of Birth: 1955/07/30 CSN: [id number] Age: 64 Admit Type: Inpatient Procedure:                Upper GI endoscopy Indications:              Heme positive stool Providers:                Shirley Friar, MD, Dwain Sarna, RN,                            Kandice Robinsons, Technician Referring MD:              Medicines:                Propofol per Anesthesia, Monitored Anesthesia Care Complications:            No immediate complications. Estimated Blood Loss:     Estimated blood loss: none. Procedure:                Pre-Anesthesia Assessment:                           - Prior to the procedure, a History and Physical                            was performed, and patient medications and                            allergies were reviewed. The patient's tolerance of                            previous anesthesia was also reviewed. The risks                            and benefits of the procedure and the sedation                            options and risks were discussed with the patient.                            All questions were answered, and informed consent                            was obtained. Prior Anticoagulants: The patient has                            taken no previous anticoagulant or antiplatelet                            agents. ASA Grade Assessment: III - A patient with                            severe systemic disease. After reviewing the risks  and benefits, the patient was deemed in                            satisfactory condition to undergo the procedure.                           After obtaining informed consent, the endoscope was                            passed under direct vision. Throughout the                            procedure, the patient's blood pressure, pulse, and                             oxygen saturations were monitored continuously. The                            [id number]-H190 ([id number]) Olympus gastroscope was                            introduced through the mouth, and advanced to the                            second part of duodenum. The upper GI endoscopy was                            accomplished without difficulty. The patient                            tolerated the procedure well. Scope In: Scope Out: Findings:      The examined esophagus was normal.      The Z-line was regular and was found 42 cm from the incisors.      A small hiatal hernia was present.      The exam of the stomach was otherwise normal.      The examined duodenum was normal.      The cardia and gastric fundus were normal on retroflexion. Impression:               - Normal esophagus.                           - Z-line regular, 42 cm from the incisors.                           - Small hiatal hernia.                           - Normal examined duodenum.                           - No specimens collected. Moderate Sedation:      Not Applicable - Patient had care per Anesthesia. Recommendation:           - Observe patient's clinical course. Procedure Code(s):        ---  Professional ---                           [phone], Esophagogastroduodenoscopy, flexible,                            transoral; diagnostic, including collection of                            specimen(s) by brushing or washing, when performed                            (separate procedure) Diagnosis Code(s):        --- Professional ---                           R19.5, Other fecal abnormalities                           K44.9, Diaphragmatic hernia without obstruction or                            gangrene CPT copyright 2019 American Medical Association. All rights reserved. The codes documented in this report are preliminary and upon coder review may  be revised to meet current compliance requirements. [name],  MD 05/15/2019 11:15:01 AM This report has been signed electronically. Number of Addenda: 0

## 2019-05-15 NOTE — Interval H&P Note (Signed)
History and Physical Interval Note:  05/15/2019 10:24 AM  Bruce Gonzales  has presented today for surgery, with the diagnosis of Positive FIT test.  The various methods of treatment have been discussed with the patient and family. After consideration of risks, benefits and other options for treatment, the patient has consented to  Procedure(s): COLONOSCOPY WITH PROPOFOL (N/A) ESOPHAGOGASTRODUODENOSCOPY (EGD) WITH PROPOFOL (N/A) as a surgical intervention.  The patient's history has been reviewed, patient examined, no change in status, stable for surgery.  I have reviewed the patient's chart and labs.  Questions were answered to the patient's satisfaction.     Shirley Friar

## 2019-05-15 NOTE — Anesthesia Postprocedure Evaluation (Signed)
Anesthesia Post Note  Patient: Bruce Gonzales  Procedure(s) Performed: COLONOSCOPY WITH PROPOFOL (N/A ) ESOPHAGOGASTRODUODENOSCOPY (EGD) WITH PROPOFOL (N/A )     Patient location during evaluation: Endoscopy Anesthesia Type: MAC Level of consciousness: patient cooperative and awake and alert Pain management: pain level controlled Vital Signs Assessment: post-procedure vital signs reviewed and stable Respiratory status: spontaneous breathing, nonlabored ventilation and respiratory function stable Cardiovascular status: blood pressure returned to baseline and stable Postop Assessment: no apparent nausea or vomiting Anesthetic complications: no    Last Vitals:  Vitals:   05/15/19 1140 05/15/19 1321  BP: (!) 124/104 (!) 112/95  Pulse: 75 71  Resp: 15 18  Temp:  36.9 C  SpO2: 96% 100%    Last Pain:  Vitals:   05/15/19 1321  TempSrc: Oral  PainSc:                  Corryn Madewell,E. Aide Wojnar

## 2019-05-15 NOTE — Care Management Obs Status (Signed)
MEDICARE OBSERVATION STATUS NOTIFICATION   Patient Details  Name: Bruce Gonzales MRN: [id number] Date of Birth: 04/15/1955   Medicare Observation Status Notification Given:  Yes    Bartholome Bill, RN 05/15/2019, 12:37 PM

## 2019-05-15 NOTE — Anesthesia Procedure Notes (Signed)
Date/Time: 05/15/2019 10:29 AM Performed by: Thornell Mule, CRNA Oxygen Delivery Method: Simple face mask

## 2019-05-15 NOTE — Brief Op Note (Signed)
Small hiatal hernia on EGD. Medium-sized internal hemorrhoids on colonoscopy otherwise procedures unrevealing. Suspect positive FIT test was due to hemorrhoids or a false positive. No further GI work-up needed. No repeat colonoscopy planned. Regular diet. Ok to go home today. F/U with GI prn. Spoke with wife. Informed Dr. Jarvis Newcomer.

## 2019-05-15 NOTE — Progress Notes (Signed)
Mr. Schooler's office made contact with pt's wife to verify verbal consent for procedure today.

## 2019-05-15 NOTE — Progress Notes (Signed)
Attempted to give fleets enema per order.  Patient unable to relax bottom and pulls away with each attempt.  Discussed with Dr. Bosie Clos, will go forward with procedure without enema.

## 2019-05-16 ENCOUNTER — Encounter: Payer: Self-pay | Admitting: *Deleted

## 2019-05-16 DIAGNOSIS — E785 Hyperlipidemia, unspecified: Secondary | ICD-10-CM | POA: Diagnosis not present

## 2019-05-16 DIAGNOSIS — I2510 Atherosclerotic heart disease of native coronary artery without angina pectoris: Secondary | ICD-10-CM | POA: Diagnosis not present

## 2019-05-16 DIAGNOSIS — G931 Anoxic brain damage, not elsewhere classified: Secondary | ICD-10-CM | POA: Diagnosis not present

## 2019-05-21 ENCOUNTER — Ambulatory Visit: Payer: Medicare HMO | Attending: Internal Medicine

## 2019-05-21 DIAGNOSIS — Z23 Encounter for immunization: Secondary | ICD-10-CM

## 2019-05-21 NOTE — Progress Notes (Signed)
   Covid-19 Vaccination Clinic  Name:  PIKE SCANTLEBURY    MRN: [id number] DOB: 1955/07/26  05/21/2019  Mr. Mallari was observed post Covid-19 immunization for 15 minutes without incident. He was provided with Vaccine Information Sheet and instruction to access the V-Safe system.   Mr. Pfost was instructed to call 911 with any severe reactions post vaccine: Marland Kitchen Difficulty breathing  . Swelling of face and throat  . A fast heartbeat  . A bad rash all over body  . Dizziness and weakness   Immunizations Administered    Name Date Dose VIS Date Route   Pfizer COVID-19 Vaccine 05/21/2019 10:56 AM 0.3 mL 02/08/2019 Intramuscular   Manufacturer: [employer], [employer]   Lot: [id number]   NDC: [address]-7

## 2019-05-23 DIAGNOSIS — G931 Anoxic brain damage, not elsewhere classified: Secondary | ICD-10-CM | POA: Diagnosis not present

## 2019-05-23 DIAGNOSIS — Z8619 Personal history of other infectious and parasitic diseases: Secondary | ICD-10-CM | POA: Diagnosis not present

## 2019-05-23 DIAGNOSIS — E785 Hyperlipidemia, unspecified: Secondary | ICD-10-CM | POA: Diagnosis not present

## 2019-05-23 DIAGNOSIS — K640 First degree hemorrhoids: Secondary | ICD-10-CM | POA: Diagnosis not present

## 2019-05-23 DIAGNOSIS — I2510 Atherosclerotic heart disease of native coronary artery without angina pectoris: Secondary | ICD-10-CM | POA: Diagnosis not present

## 2019-08-15 DIAGNOSIS — I10 Essential (primary) hypertension: Secondary | ICD-10-CM | POA: Diagnosis not present

## 2019-08-15 DIAGNOSIS — I2510 Atherosclerotic heart disease of native coronary artery without angina pectoris: Secondary | ICD-10-CM | POA: Diagnosis not present

## 2019-08-15 DIAGNOSIS — E785 Hyperlipidemia, unspecified: Secondary | ICD-10-CM | POA: Diagnosis not present

## 2019-11-14 DIAGNOSIS — E785 Hyperlipidemia, unspecified: Secondary | ICD-10-CM | POA: Diagnosis not present

## 2019-11-14 DIAGNOSIS — I10 Essential (primary) hypertension: Secondary | ICD-10-CM | POA: Diagnosis not present

## 2019-11-14 DIAGNOSIS — I2510 Atherosclerotic heart disease of native coronary artery without angina pectoris: Secondary | ICD-10-CM | POA: Diagnosis not present

## 2020-02-13 DIAGNOSIS — E785 Hyperlipidemia, unspecified: Secondary | ICD-10-CM | POA: Diagnosis not present

## 2020-02-13 DIAGNOSIS — I10 Essential (primary) hypertension: Secondary | ICD-10-CM | POA: Diagnosis not present

## 2020-02-13 DIAGNOSIS — I2510 Atherosclerotic heart disease of native coronary artery without angina pectoris: Secondary | ICD-10-CM | POA: Diagnosis not present

## 2020-02-13 DIAGNOSIS — G931 Anoxic brain damage, not elsewhere classified: Secondary | ICD-10-CM | POA: Diagnosis not present

## 2020-03-03 DIAGNOSIS — E785 Hyperlipidemia, unspecified: Secondary | ICD-10-CM | POA: Diagnosis not present

## 2020-03-03 DIAGNOSIS — Z125 Encounter for screening for malignant neoplasm of prostate: Secondary | ICD-10-CM | POA: Diagnosis not present

## 2020-03-03 DIAGNOSIS — Z1211 Encounter for screening for malignant neoplasm of colon: Secondary | ICD-10-CM | POA: Diagnosis not present

## 2020-03-03 DIAGNOSIS — Z0000 Encounter for general adult medical examination without abnormal findings: Secondary | ICD-10-CM | POA: Diagnosis not present

## 2020-03-03 DIAGNOSIS — R300 Dysuria: Secondary | ICD-10-CM | POA: Diagnosis not present

## 2020-03-03 DIAGNOSIS — G931 Anoxic brain damage, not elsewhere classified: Secondary | ICD-10-CM | POA: Diagnosis not present

## 2020-03-03 DIAGNOSIS — Z23 Encounter for immunization: Secondary | ICD-10-CM | POA: Diagnosis not present

## 2020-05-14 DIAGNOSIS — G931 Anoxic brain damage, not elsewhere classified: Secondary | ICD-10-CM | POA: Diagnosis not present

## 2020-05-14 DIAGNOSIS — E785 Hyperlipidemia, unspecified: Secondary | ICD-10-CM | POA: Diagnosis not present

## 2020-05-14 DIAGNOSIS — I10 Essential (primary) hypertension: Secondary | ICD-10-CM | POA: Diagnosis not present

## 2020-05-14 DIAGNOSIS — I2510 Atherosclerotic heart disease of native coronary artery without angina pectoris: Secondary | ICD-10-CM | POA: Diagnosis not present

## 2020-06-17 DIAGNOSIS — M25512 Pain in left shoulder: Secondary | ICD-10-CM | POA: Diagnosis not present

## 2020-08-20 DIAGNOSIS — I10 Essential (primary) hypertension: Secondary | ICD-10-CM | POA: Diagnosis not present

## 2020-08-20 DIAGNOSIS — I2510 Atherosclerotic heart disease of native coronary artery without angina pectoris: Secondary | ICD-10-CM | POA: Diagnosis not present

## 2020-08-20 DIAGNOSIS — G931 Anoxic brain damage, not elsewhere classified: Secondary | ICD-10-CM | POA: Diagnosis not present

## 2020-08-20 DIAGNOSIS — E785 Hyperlipidemia, unspecified: Secondary | ICD-10-CM | POA: Diagnosis not present

## 2020-11-12 IMAGING — CT CT ABD-PELV W/ CM
2 of 5 series · 16 of 46 positions shown, 18 images · IV contrast (Omni 300)
Comparison: None.

CLINICAL DATA: Abdominal distention.

EXAM:
CT ABDOMEN AND PELVIS WITH CONTRAST
TECHNIQUE: Multidetector CT imaging of the abdomen and pelvis was performed
using the standard protocol following bolus administration of
intravenous contrast.
CONTRAST:  100mL OMNIPAQUE IOHEXOL 300 MG/ML  SOLN

[Series 3: a/p w/ 5mm · axial · 0.74mm/px · z∈[+426,+906]mm · 13 of 110 slices shown, 15 images]
[im 7/110  soft-tissue]
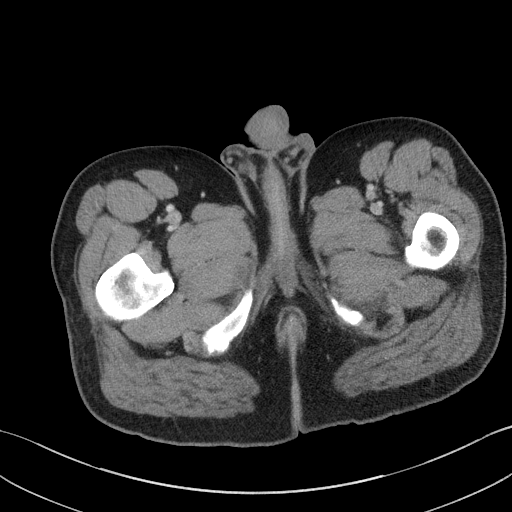
[im 7/110  bone]
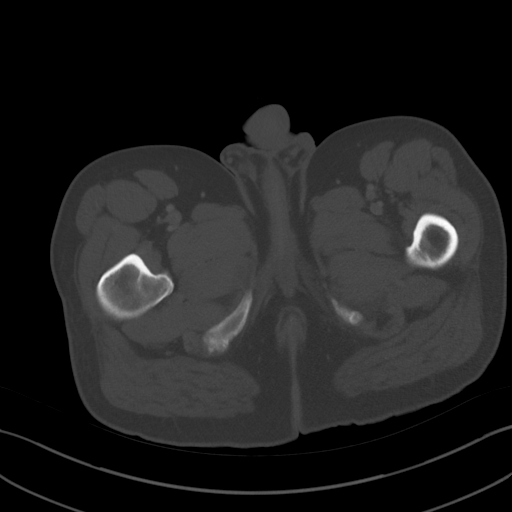
[im 13/110  soft-tissue]
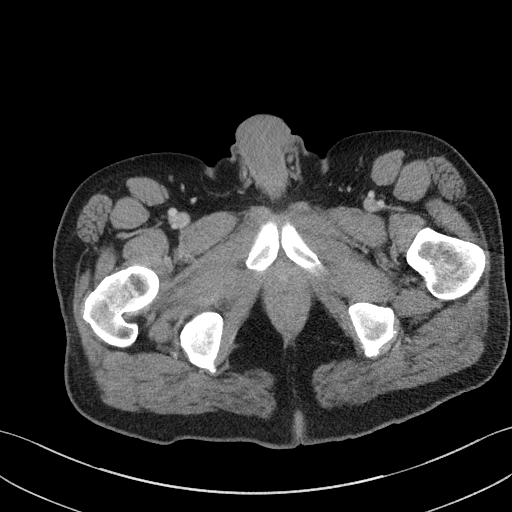
[im 25/110  soft-tissue]
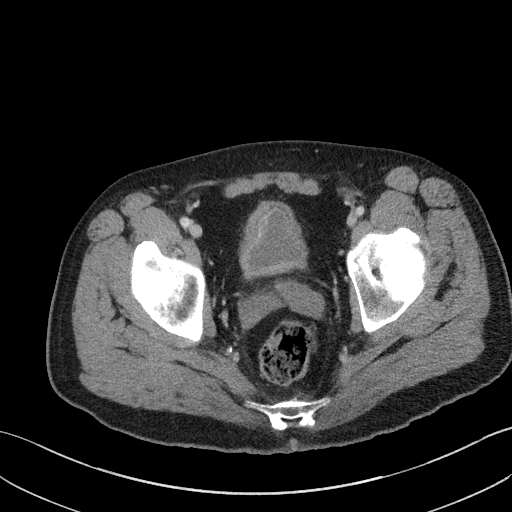
[im 31/110  soft-tissue]
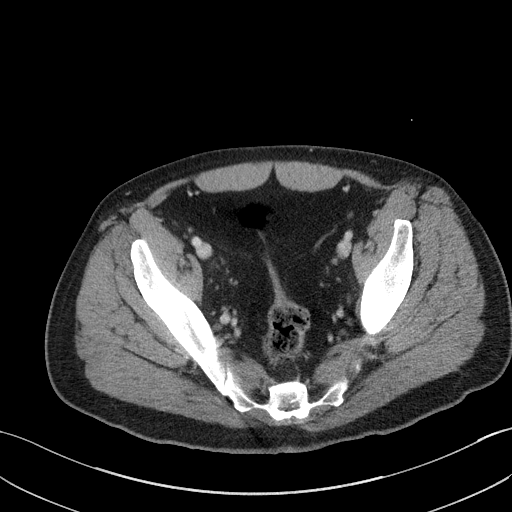
[im 37/110  soft-tissue]
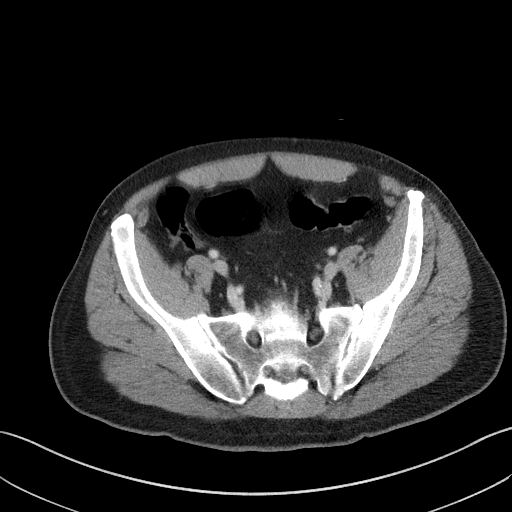
[im 49/110  soft-tissue]
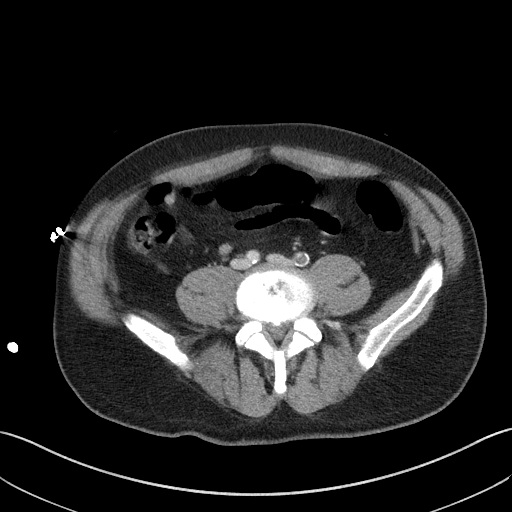
[im 55/110  soft-tissue]
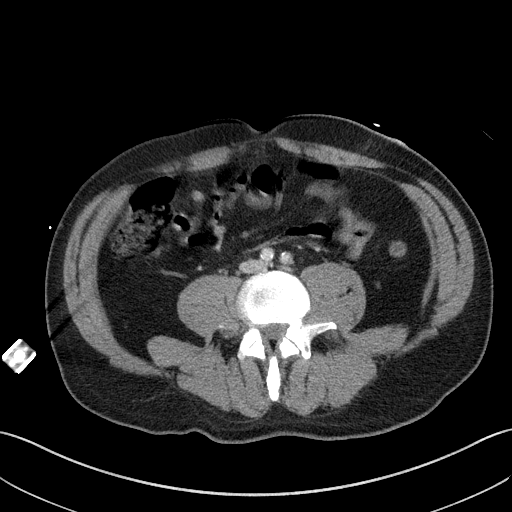
[im 61/110  soft-tissue]
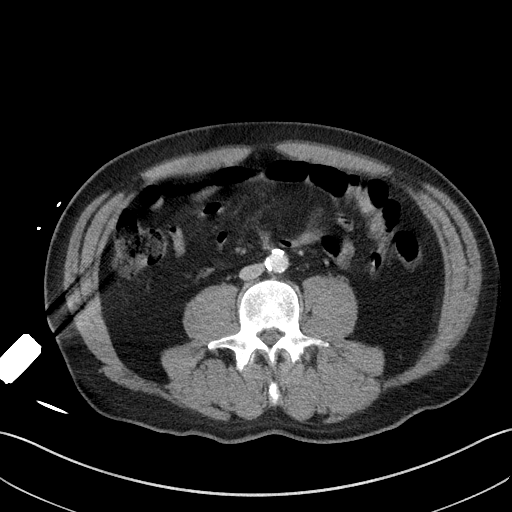
[im 73/110  soft-tissue]
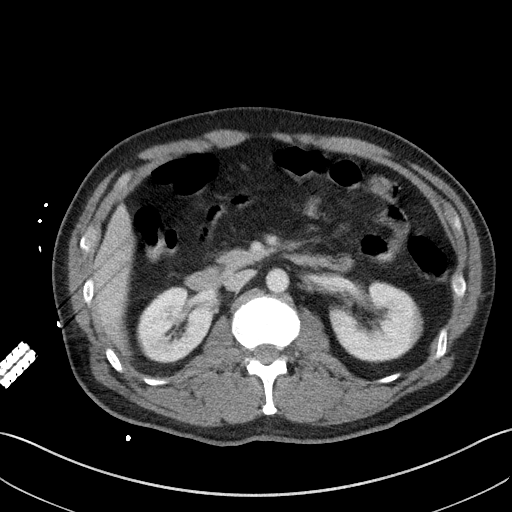
[im 73/110  bone]
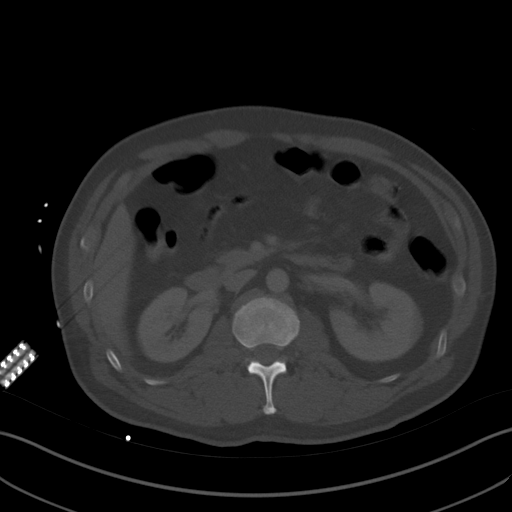
[im 79/110  soft-tissue]
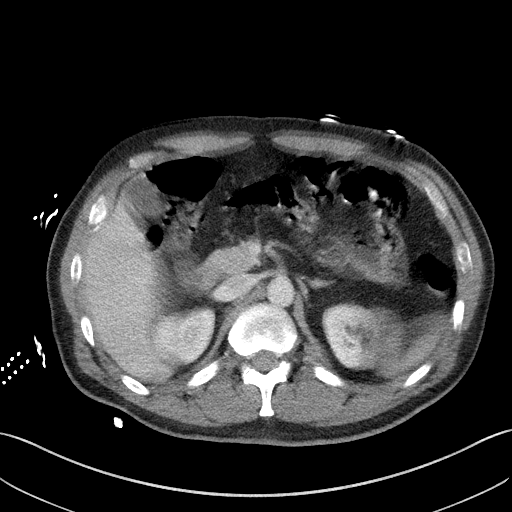
[im 85/110  soft-tissue]
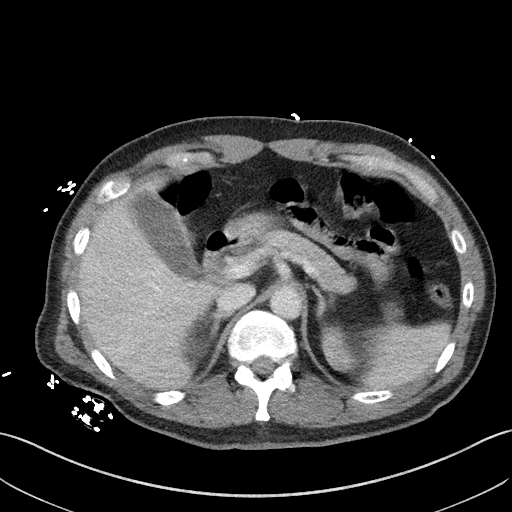
[im 97/110  soft-tissue]
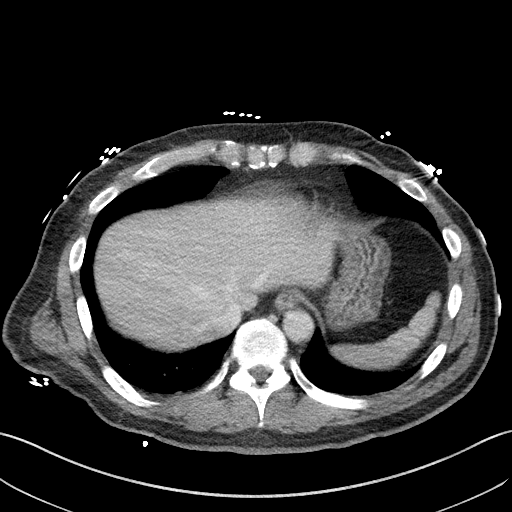
[im 103/110  soft-tissue]
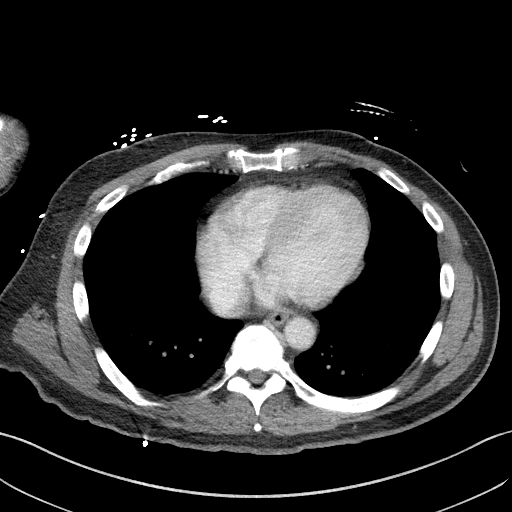

[Series 6: a/p w/ cor · coronal · 0.69mm/px · 3 of 132 slices shown]
[im 59/132  soft-tissue]
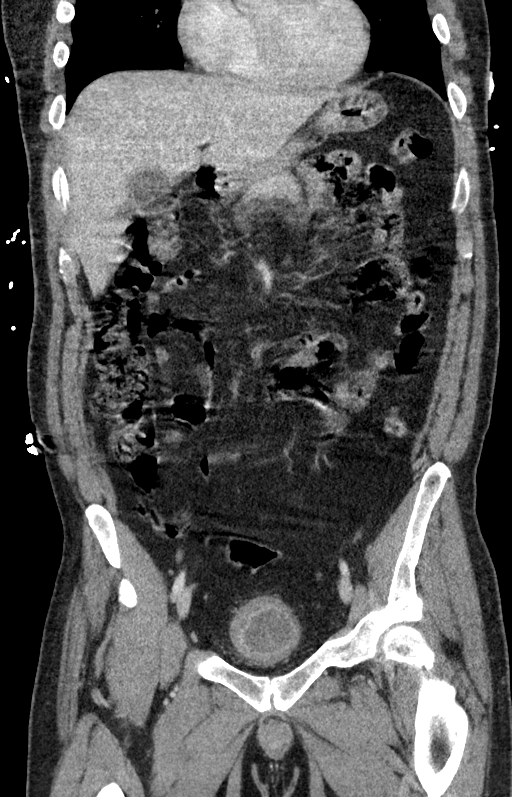
[im 73/132  soft-tissue]
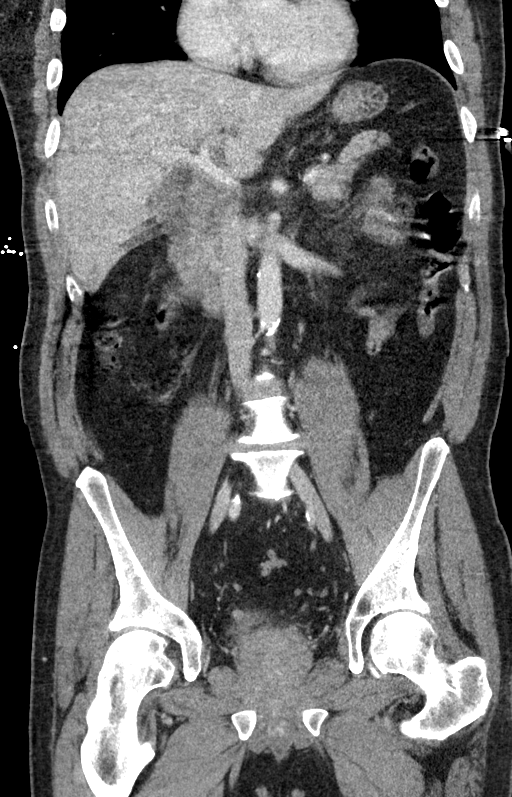
[im 88/132  soft-tissue]
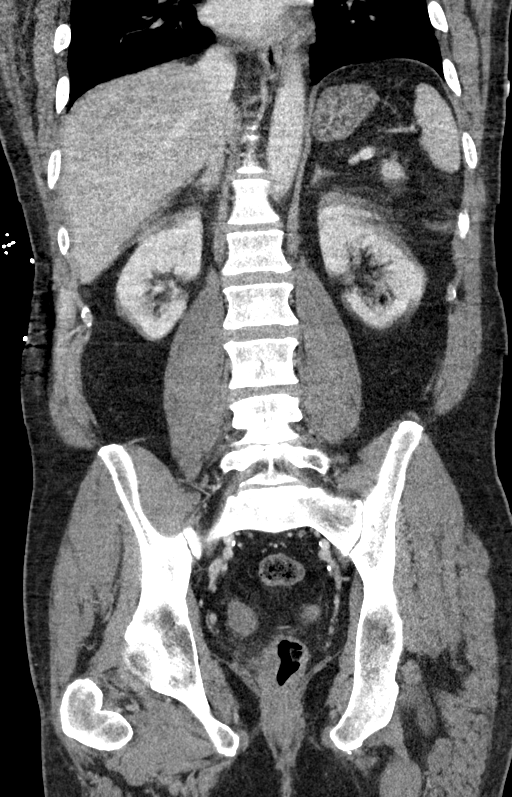

[16 of 46 positions shown; findings below may reference images not displayed]

FINDINGS: Lower chest: Lung bases are clear. No effusions. Heart is normal
size.

Hepatobiliary: No focal hepatic abnormality. Gallbladder
unremarkable.

Pancreas: No focal abnormality or ductal dilatation.

Spleen: No focal abnormality.  Normal size.

Adrenals/Urinary Tract: No hydronephrosis. No renal or adrenal mass.
Urinary bladder appears thick walled, decompressed.

Stomach/Bowel: Normal appendix. Stomach, large and small bowel
grossly unremarkable.

Vascular/Lymphatic: Aortic atherosclerosis. No enlarged abdominal or
pelvic lymph nodes.

Reproductive: Prostate enlargement.

Other: No free fluid or free air.

Musculoskeletal: No acute bony abnormality.
IMPRESSION: Thickened bladder wall. This could be related to bladder outlet
obstruction with the enlarged prostate. This could also represent
cystitis. Recommend clinical correlation.

Aortic atherosclerosis.

## 2020-11-13 IMAGING — US US ABDOMEN LIMITED
1 series · 14 of 25 positions shown · non-contrast
Comparison: CT abdomen and pelvis January 27, 2019.

CLINICAL DATA: Elevated liver enzymes

EXAM:
ULTRASOUND ABDOMEN LIMITED RIGHT UPPER QUADRANT

[Series 1: us abdomen limited · 28 acquisitions, 14 frames shown]
[im 1/28]
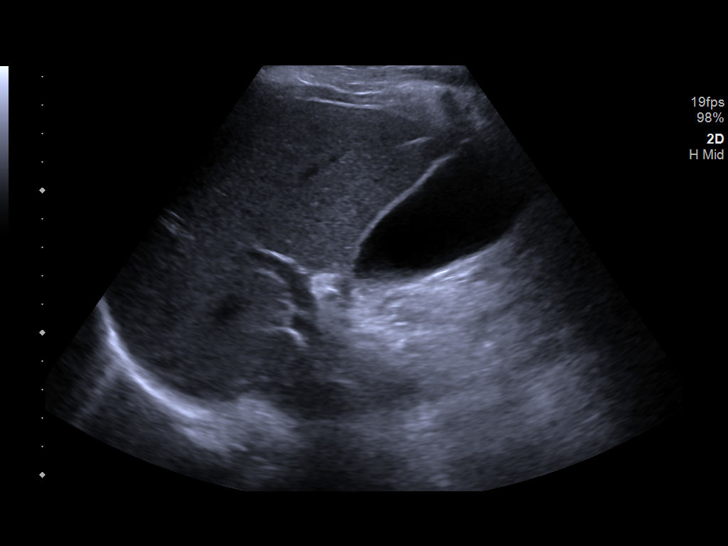
[im 3/28]
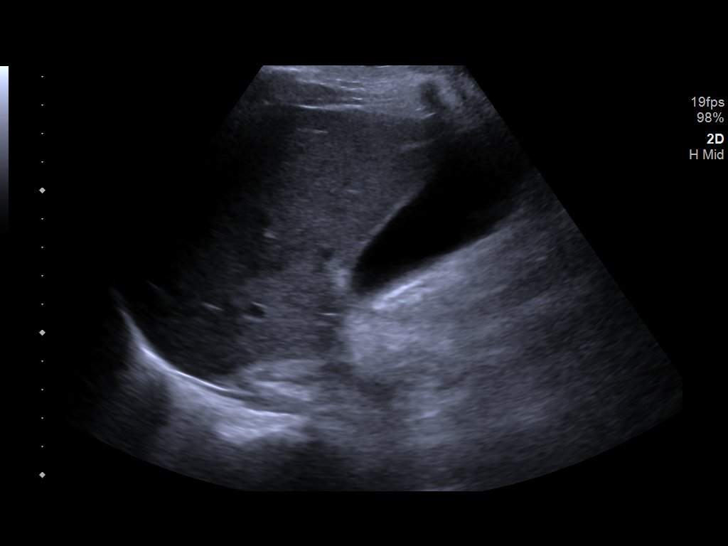
[im 5/28]
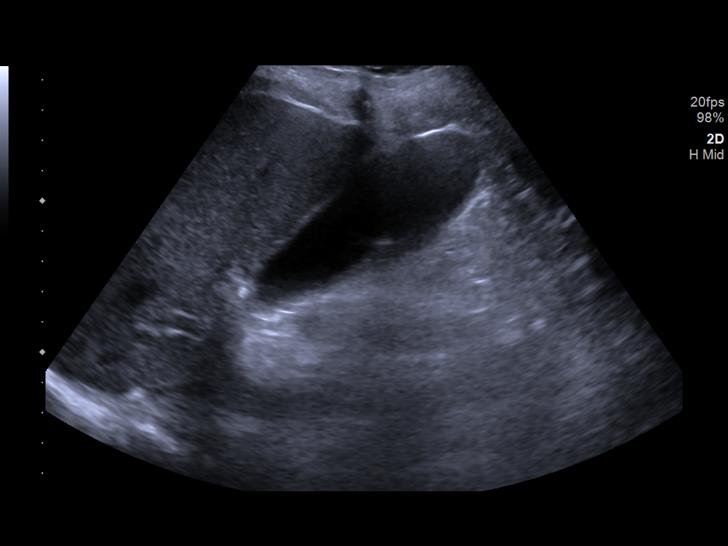
[im 7/28]
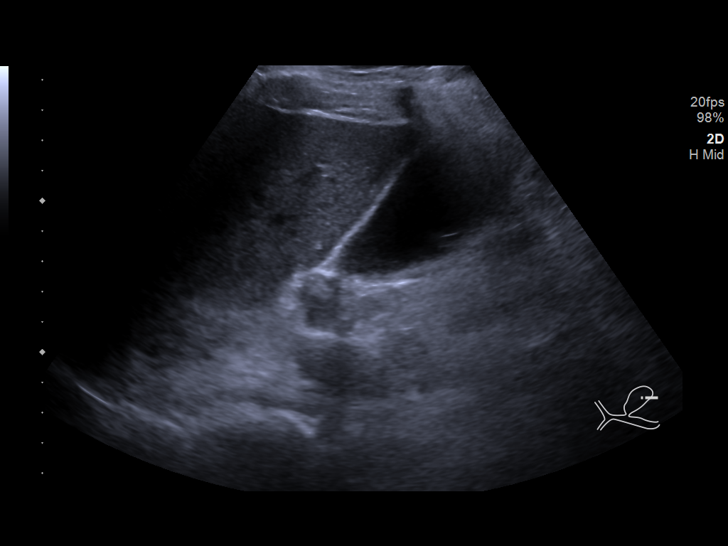
[im 10/28]
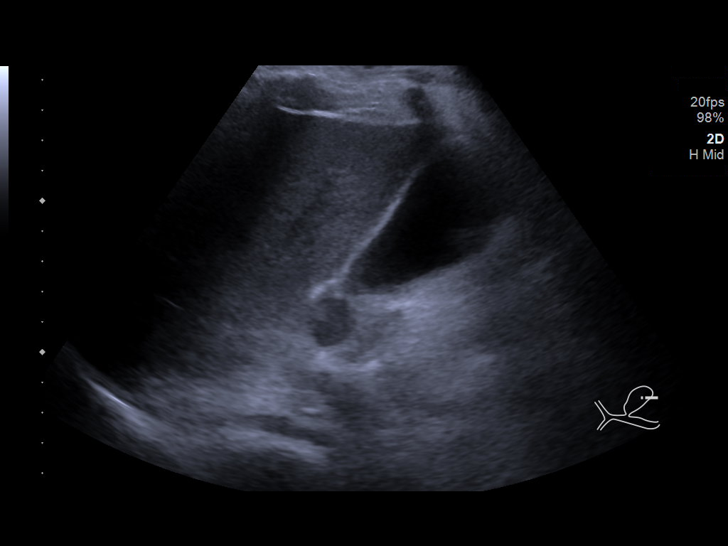
[im 11/28]
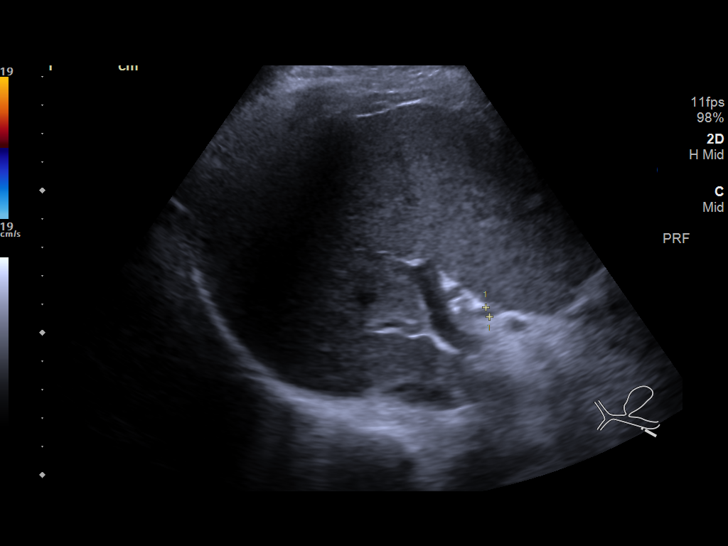
[im 13/28]
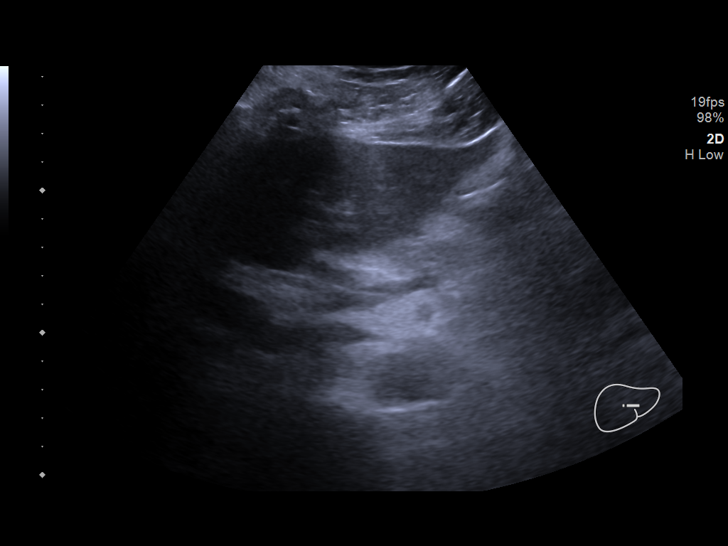
[im 15/28]
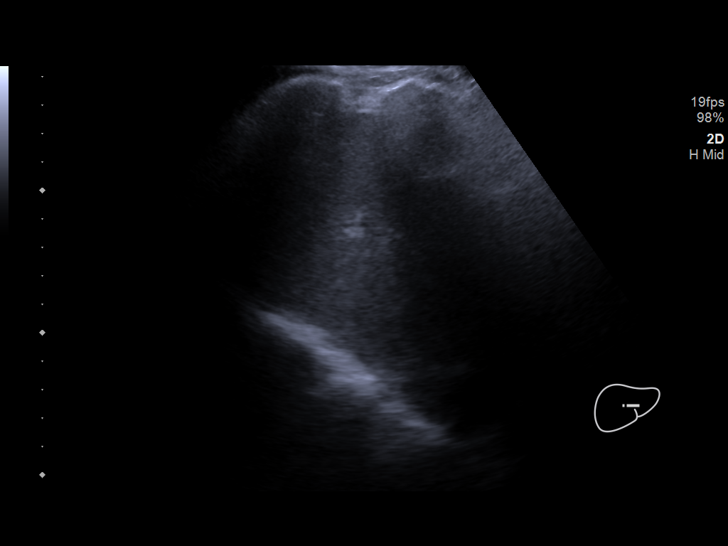
[im 17/28]
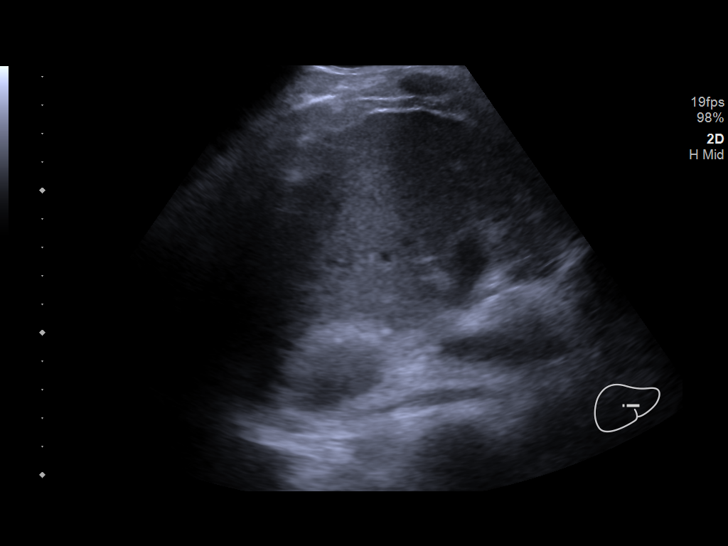
[im 19/28]
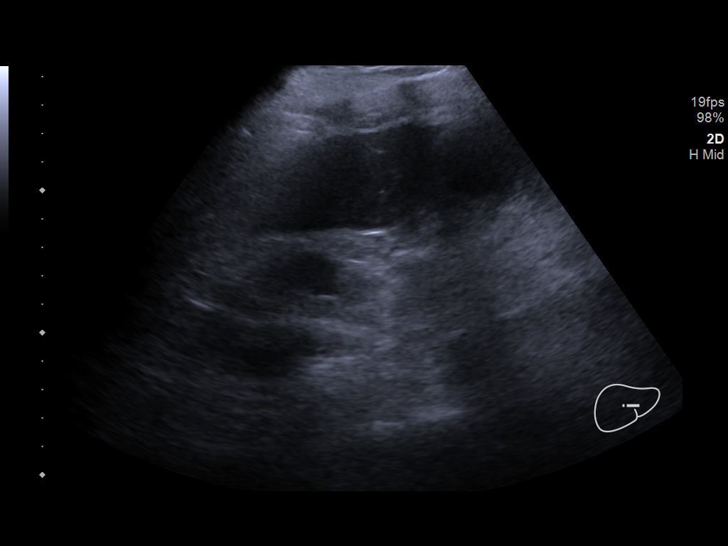
[im 21/28]
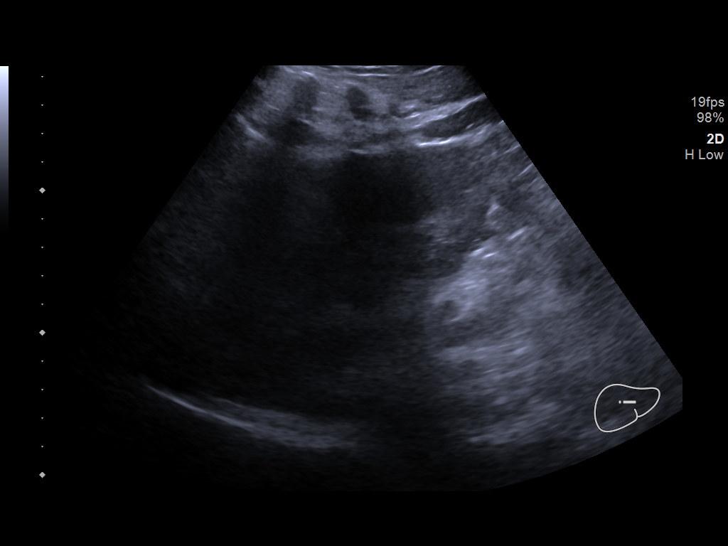
[im 23/28]
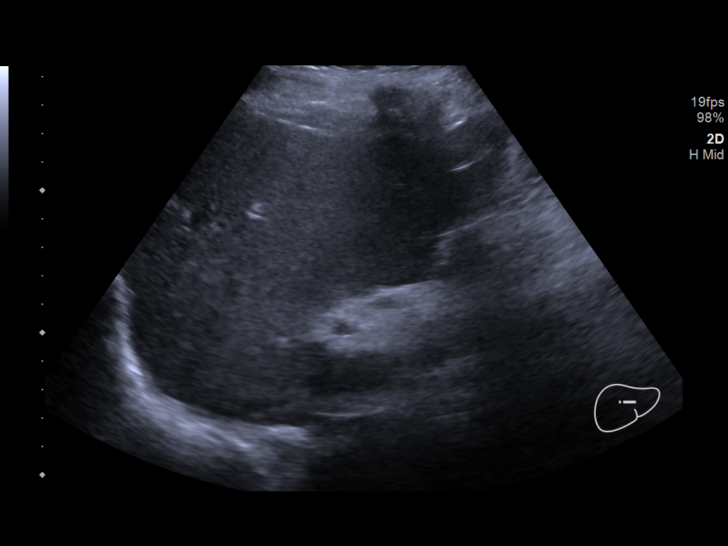
[im 25/28]
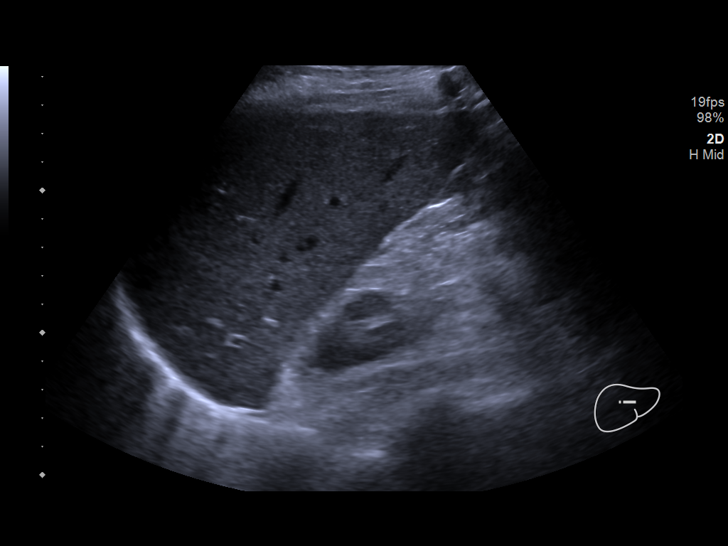
[im 28/28]
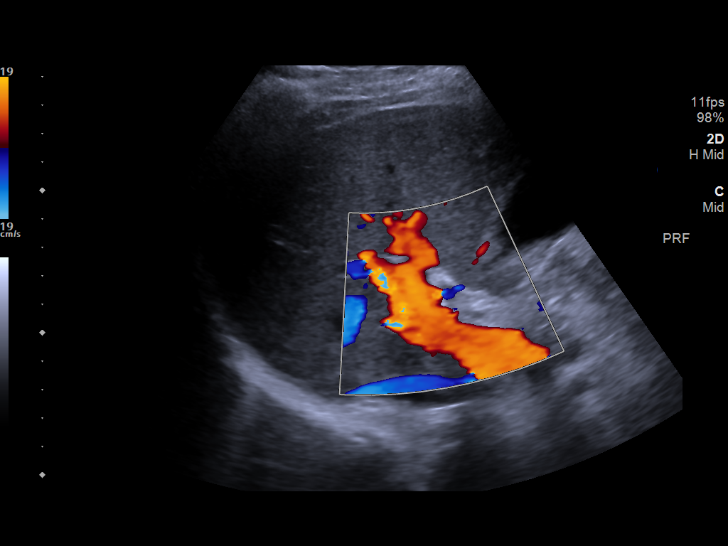

[14 of 25 positions shown; findings below may reference images not displayed]

FINDINGS: Gallbladder:

No gallstones or wall thickening visualized. There is no
pericholecystic fluid. No sonographic Murphy sign noted by
sonographer.

Common bile duct:

Diameter: 4 mm. No intrahepatic or extrahepatic biliary duct
dilatation.

Liver:

No focal lesion identified. Within normal limits in parenchymal
echogenicity. Portal vein is patent on color Doppler imaging with
normal direction of blood flow towards the liver.

Other: None.
IMPRESSION: Study within normal limits.

## 2020-11-19 DIAGNOSIS — I10 Essential (primary) hypertension: Secondary | ICD-10-CM | POA: Diagnosis not present

## 2020-11-19 DIAGNOSIS — I2510 Atherosclerotic heart disease of native coronary artery without angina pectoris: Secondary | ICD-10-CM | POA: Diagnosis not present

## 2020-11-19 DIAGNOSIS — E785 Hyperlipidemia, unspecified: Secondary | ICD-10-CM | POA: Diagnosis not present

## 2021-03-23 DIAGNOSIS — Z23 Encounter for immunization: Secondary | ICD-10-CM | POA: Diagnosis not present

## 2021-03-23 DIAGNOSIS — Z0000 Encounter for general adult medical examination without abnormal findings: Secondary | ICD-10-CM | POA: Diagnosis not present

## 2021-03-23 DIAGNOSIS — I2510 Atherosclerotic heart disease of native coronary artery without angina pectoris: Secondary | ICD-10-CM | POA: Diagnosis not present

## 2021-03-23 DIAGNOSIS — D72829 Elevated white blood cell count, unspecified: Secondary | ICD-10-CM | POA: Diagnosis not present

## 2021-03-23 DIAGNOSIS — B351 Tinea unguium: Secondary | ICD-10-CM | POA: Diagnosis not present

## 2021-03-23 DIAGNOSIS — I700 Atherosclerosis of aorta: Secondary | ICD-10-CM | POA: Diagnosis not present

## 2021-03-23 DIAGNOSIS — E785 Hyperlipidemia, unspecified: Secondary | ICD-10-CM | POA: Diagnosis not present

## 2021-03-23 DIAGNOSIS — Z125 Encounter for screening for malignant neoplasm of prostate: Secondary | ICD-10-CM | POA: Diagnosis not present

## 2021-04-15 DIAGNOSIS — I2510 Atherosclerotic heart disease of native coronary artery without angina pectoris: Secondary | ICD-10-CM | POA: Diagnosis not present

## 2021-04-15 DIAGNOSIS — G931 Anoxic brain damage, not elsewhere classified: Secondary | ICD-10-CM | POA: Diagnosis not present

## 2021-04-15 DIAGNOSIS — E785 Hyperlipidemia, unspecified: Secondary | ICD-10-CM | POA: Diagnosis not present

## 2021-04-15 DIAGNOSIS — I10 Essential (primary) hypertension: Secondary | ICD-10-CM | POA: Diagnosis not present

## 2021-06-18 ENCOUNTER — Encounter: Payer: Self-pay | Admitting: Podiatry

## 2021-06-18 ENCOUNTER — Ambulatory Visit: Payer: Medicare HMO | Admitting: Podiatry

## 2021-06-18 DIAGNOSIS — B351 Tinea unguium: Secondary | ICD-10-CM

## 2021-06-18 DIAGNOSIS — M79674 Pain in right toe(s): Secondary | ICD-10-CM

## 2021-06-18 DIAGNOSIS — M79675 Pain in left toe(s): Secondary | ICD-10-CM | POA: Diagnosis not present

## 2021-06-23 NOTE — Progress Notes (Signed)
?  Subjective:  ?Patient ID: Bruce Gonzales, male    DOB: Nov 02, 1955,  MRN: [id number] ? ?Chief Complaint  ?Patient presents with  ? Nail Problem  ? ?66 y.o. male returns for the above complaint.  Patient presents with thickened elongated dystrophic toenails x10.  He states painful to touch he is not able to do it himself.  He denies any other acute complaints he is not a diabetic. ? ?Objective:  ?There were no vitals filed for this visit. ?Podiatric Exam: ?Vascular: dorsalis pedis and posterior tibial pulses are palpable bilateral. Capillary return is immediate. Temperature gradient is WNL. Skin turgor WNL  ?Sensorium: Normal Semmes Weinstein monofilament test. Normal tactile sensation bilaterally. ?Nail Exam: Pt has thick disfigured discolored nails with subungual debris noted bilateral entire nail hallux through fifth toenails.  Pain on palpation to the nails. ?Ulcer Exam: There is no evidence of ulcer or pre-ulcerative changes or infection. ?Orthopedic Exam: Muscle tone and strength are WNL. No limitations in general ROM. No crepitus or effusions noted.  ?Skin: No Porokeratosis. No infection or ulcers ? ? ? ?Assessment & Plan:  ? ?1. Pain due to onychomycosis of toenails of both feet   ? ? ?Patient was evaluated and treated and all questions answered. ? ?Onychomycosis with pain  ?-Nails palliatively debrided as below. ?-Educated on self-care ? ?Procedure: Nail Debridement ?Rationale: pain  ?Type of Debridement: manual, sharp debridement. ?Instrumentation: Nail nipper, rotary burr. ?Number of Nails: 10 ? ?Procedures and Treatment: Consent by patient was obtained for treatment procedures. The patient understood the discussion of treatment and procedures well. All questions were answered thoroughly reviewed. Debridement of mycotic and hypertrophic toenails, 1 through 5 bilateral and clearing of subungual debris. No ulceration, no infection noted.  ?Return Visit-Office Procedure: Patient instructed to return to the  office for a follow up visit 3 months for continued evaluation and treatment. ? ?Nicholes Rough, DPM ?  ? ?Return in about 3 months (around 09/17/2021). ? ?

## 2021-06-24 ENCOUNTER — Encounter: Payer: Self-pay | Admitting: Podiatry

## 2021-07-15 DIAGNOSIS — I2510 Atherosclerotic heart disease of native coronary artery without angina pectoris: Secondary | ICD-10-CM | POA: Diagnosis not present

## 2021-07-15 DIAGNOSIS — E785 Hyperlipidemia, unspecified: Secondary | ICD-10-CM | POA: Diagnosis not present

## 2021-07-15 DIAGNOSIS — E039 Hypothyroidism, unspecified: Secondary | ICD-10-CM | POA: Diagnosis not present

## 2021-07-15 DIAGNOSIS — I10 Essential (primary) hypertension: Secondary | ICD-10-CM | POA: Diagnosis not present

## 2021-10-15 DIAGNOSIS — I10 Essential (primary) hypertension: Secondary | ICD-10-CM | POA: Diagnosis not present

## 2021-10-15 DIAGNOSIS — E785 Hyperlipidemia, unspecified: Secondary | ICD-10-CM | POA: Diagnosis not present

## 2021-10-15 DIAGNOSIS — I2510 Atherosclerotic heart disease of native coronary artery without angina pectoris: Secondary | ICD-10-CM | POA: Diagnosis not present

## 2022-01-26 DIAGNOSIS — E785 Hyperlipidemia, unspecified: Secondary | ICD-10-CM | POA: Diagnosis not present

## 2022-01-26 DIAGNOSIS — I10 Essential (primary) hypertension: Secondary | ICD-10-CM | POA: Diagnosis not present

## 2022-01-26 DIAGNOSIS — I2510 Atherosclerotic heart disease of native coronary artery without angina pectoris: Secondary | ICD-10-CM | POA: Diagnosis not present

## 2022-03-29 DIAGNOSIS — Z0000 Encounter for general adult medical examination without abnormal findings: Secondary | ICD-10-CM | POA: Diagnosis not present

## 2022-03-29 DIAGNOSIS — I700 Atherosclerosis of aorta: Secondary | ICD-10-CM | POA: Diagnosis not present

## 2022-03-29 DIAGNOSIS — Z125 Encounter for screening for malignant neoplasm of prostate: Secondary | ICD-10-CM | POA: Diagnosis not present

## 2022-03-29 DIAGNOSIS — E785 Hyperlipidemia, unspecified: Secondary | ICD-10-CM | POA: Diagnosis not present

## 2022-03-29 DIAGNOSIS — G931 Anoxic brain damage, not elsewhere classified: Secondary | ICD-10-CM | POA: Diagnosis not present

## 2022-03-29 DIAGNOSIS — I2510 Atherosclerotic heart disease of native coronary artery without angina pectoris: Secondary | ICD-10-CM | POA: Diagnosis not present

## 2022-03-29 DIAGNOSIS — R32 Unspecified urinary incontinence: Secondary | ICD-10-CM | POA: Diagnosis not present

## 2022-03-29 DIAGNOSIS — Z23 Encounter for immunization: Secondary | ICD-10-CM | POA: Diagnosis not present

## 2022-07-29 DIAGNOSIS — E782 Mixed hyperlipidemia: Secondary | ICD-10-CM | POA: Diagnosis not present

## 2022-07-29 DIAGNOSIS — I2510 Atherosclerotic heart disease of native coronary artery without angina pectoris: Secondary | ICD-10-CM | POA: Diagnosis not present

## 2022-07-29 DIAGNOSIS — I10 Essential (primary) hypertension: Secondary | ICD-10-CM | POA: Diagnosis not present

## 2022-10-22 ENCOUNTER — Other Ambulatory Visit: Payer: Self-pay

## 2022-10-22 ENCOUNTER — Encounter (HOSPITAL_COMMUNITY): Payer: Self-pay | Admitting: *Deleted

## 2022-10-22 ENCOUNTER — Emergency Department (HOSPITAL_COMMUNITY): Payer: Medicare HMO

## 2022-10-22 ENCOUNTER — Emergency Department (HOSPITAL_COMMUNITY)
Admission: EM | Admit: 2022-10-22 | Discharge: 2022-10-22 | Disposition: A | Discharge status: Home / Self Care | Payer: Medicare HMO | Attending: Emergency Medicine | Admitting: Emergency Medicine

## 2022-10-22 DIAGNOSIS — R55 Syncope and collapse: Secondary | ICD-10-CM | POA: Diagnosis not present

## 2022-10-22 DIAGNOSIS — R4182 Altered mental status, unspecified: Secondary | ICD-10-CM | POA: Diagnosis not present

## 2022-10-22 DIAGNOSIS — W19XXXA Unspecified fall, initial encounter: Secondary | ICD-10-CM | POA: Diagnosis not present

## 2022-10-22 DIAGNOSIS — R531 Weakness: Secondary | ICD-10-CM | POA: Diagnosis not present

## 2022-10-22 DIAGNOSIS — I959 Hypotension, unspecified: Secondary | ICD-10-CM | POA: Diagnosis not present

## 2022-10-22 LAB — CBC WITH DIFFERENTIAL/PLATELET
Abs Immature Granulocytes: 0.04 10*3/uL (ref 0.00–0.07)
Basophils Absolute: 0 10*3/uL (ref 0.0–0.1)
Basophils Relative: 0 %
Eosinophils Absolute: 0 10*3/uL (ref 0.0–0.5)
Eosinophils Relative: 0 %
HCT: 41.6 % (ref 39.0–52.0)
Hemoglobin: 13.3 g/dL (ref 13.0–17.0)
Immature Granulocytes: 0 %
Lymphocytes Relative: 9 %
Lymphs Abs: 1 10*3/uL (ref 0.7–4.0)
MCH: 29.7 pg (ref 26.0–34.0)
MCHC: 32 g/dL (ref 30.0–36.0)
MCV: 92.9 fL (ref 80.0–100.0)
Monocytes Absolute: 0.7 10*3/uL (ref 0.1–1.0)
Monocytes Relative: 6 %
Neutro Abs: 9.1 10*3/uL — ABNORMAL HIGH (ref 1.7–7.7)
Neutrophils Relative %: 85 %
Platelets: 168 10*3/uL (ref 150–400)
RBC: 4.48 MIL/uL (ref 4.22–5.81)
RDW: 13.9 % (ref 11.5–15.5)
WBC: 10.8 10*3/uL — ABNORMAL HIGH (ref 4.0–10.5)
nRBC: 0 % (ref 0.0–0.2)

## 2022-10-22 LAB — COMPREHENSIVE METABOLIC PANEL
ALT: 24 U/L (ref 0–44)
AST: 29 U/L (ref 15–41)
Albumin: 3.2 g/dL — ABNORMAL LOW (ref 3.5–5.0)
Alkaline Phosphatase: 66 U/L (ref 38–126)
Anion gap: 7 (ref 5–15)
BUN: 15 mg/dL (ref 8–23)
CO2: 28 mmol/L (ref 22–32)
Calcium: 8.9 mg/dL (ref 8.9–10.3)
Chloride: 103 mmol/L (ref 98–111)
Creatinine, Ser: 1.21 mg/dL (ref 0.61–1.24)
GFR, Estimated: >60 mL/min (ref >60)
Glucose, Bld: 118 mg/dL — ABNORMAL HIGH (ref 70–99)
Potassium: 3.8 mmol/L (ref 3.5–5.1)
Sodium: 138 mmol/L (ref 135–145)
Total Bilirubin: 1.9 mg/dL — ABNORMAL HIGH (ref 0.3–1.2)
Total Protein: 7.1 g/dL (ref 6.5–8.1)

## 2022-10-22 MED ORDER — FOSFOMYCIN TROMETHAMINE 3 G PO PACK
3.0000 g | PACK | Freq: Once | ORAL | Status: AC
  Administered 2022-10-22: 3 g via ORAL
  Filled 2022-10-22: qty 3
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
## 2022-10-22 NOTE — ED Notes (Signed)
Attempted straight cath unsuccessful MD aware
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
## 2022-10-22 NOTE — Discharge Instructions (Addendum)
As discussed, your evaluation today has been largely reassuring.  But, it is important that you monitor your condition carefully, and do not hesitate to return to the ED if you develop new, or concerning changes in your condition.  Please discuss your medication regimen, today's episode with your primary care physician.

## 2022-10-28 DIAGNOSIS — R55 Syncope and collapse: Secondary | ICD-10-CM | POA: Diagnosis not present

## 2022-10-28 DIAGNOSIS — I2510 Atherosclerotic heart disease of native coronary artery without angina pectoris: Secondary | ICD-10-CM | POA: Diagnosis not present

## 2022-10-28 DIAGNOSIS — E782 Mixed hyperlipidemia: Secondary | ICD-10-CM | POA: Diagnosis not present

## 2022-10-28 DIAGNOSIS — I10 Essential (primary) hypertension: Secondary | ICD-10-CM | POA: Diagnosis not present

## 2022-12-08 DIAGNOSIS — Z23 Encounter for immunization: Secondary | ICD-10-CM | POA: Diagnosis not present

## 2022-12-08 DIAGNOSIS — I10 Essential (primary) hypertension: Secondary | ICD-10-CM | POA: Diagnosis not present

## 2022-12-08 DIAGNOSIS — I2510 Atherosclerotic heart disease of native coronary artery without angina pectoris: Secondary | ICD-10-CM | POA: Diagnosis not present

## 2022-12-08 DIAGNOSIS — E785 Hyperlipidemia, unspecified: Secondary | ICD-10-CM | POA: Diagnosis not present

## 2022-12-08 DIAGNOSIS — I700 Atherosclerosis of aorta: Secondary | ICD-10-CM | POA: Diagnosis not present

## 2022-12-08 DIAGNOSIS — R399 Unspecified symptoms and signs involving the genitourinary system: Secondary | ICD-10-CM | POA: Diagnosis not present

## 2023-02-03 DIAGNOSIS — G931 Anoxic brain damage, not elsewhere classified: Secondary | ICD-10-CM | POA: Diagnosis not present

## 2023-02-03 DIAGNOSIS — I2510 Atherosclerotic heart disease of native coronary artery without angina pectoris: Secondary | ICD-10-CM | POA: Diagnosis not present

## 2023-02-03 DIAGNOSIS — I10 Essential (primary) hypertension: Secondary | ICD-10-CM | POA: Diagnosis not present

## 2023-02-03 DIAGNOSIS — E782 Mixed hyperlipidemia: Secondary | ICD-10-CM | POA: Diagnosis not present

## 2023-03-30 DIAGNOSIS — I700 Atherosclerosis of aorta: Secondary | ICD-10-CM | POA: Diagnosis not present

## 2023-03-30 DIAGNOSIS — I10 Essential (primary) hypertension: Secondary | ICD-10-CM | POA: Diagnosis not present

## 2023-03-30 DIAGNOSIS — G931 Anoxic brain damage, not elsewhere classified: Secondary | ICD-10-CM | POA: Diagnosis not present

## 2023-03-30 DIAGNOSIS — E785 Hyperlipidemia, unspecified: Secondary | ICD-10-CM | POA: Diagnosis not present

## 2023-03-30 DIAGNOSIS — Z125 Encounter for screening for malignant neoplasm of prostate: Secondary | ICD-10-CM | POA: Diagnosis not present

## 2023-03-30 DIAGNOSIS — Z0000 Encounter for general adult medical examination without abnormal findings: Secondary | ICD-10-CM | POA: Diagnosis not present

## 2023-03-30 DIAGNOSIS — I2510 Atherosclerotic heart disease of native coronary artery without angina pectoris: Secondary | ICD-10-CM | POA: Diagnosis not present

## 2023-03-30 DIAGNOSIS — R32 Unspecified urinary incontinence: Secondary | ICD-10-CM | POA: Diagnosis not present

## 2023-05-05 DIAGNOSIS — I2510 Atherosclerotic heart disease of native coronary artery without angina pectoris: Secondary | ICD-10-CM | POA: Diagnosis not present

## 2023-05-05 DIAGNOSIS — I10 Essential (primary) hypertension: Secondary | ICD-10-CM | POA: Diagnosis not present

## 2023-05-05 DIAGNOSIS — E782 Mixed hyperlipidemia: Secondary | ICD-10-CM | POA: Diagnosis not present

## 2023-05-15 DIAGNOSIS — R972 Elevated prostate specific antigen [PSA]: Secondary | ICD-10-CM | POA: Diagnosis not present

## 2023-05-15 DIAGNOSIS — N3000 Acute cystitis without hematuria: Secondary | ICD-10-CM | POA: Diagnosis not present

## 2023-08-25 DIAGNOSIS — I2510 Atherosclerotic heart disease of native coronary artery without angina pectoris: Secondary | ICD-10-CM | POA: Diagnosis not present

## 2023-08-25 DIAGNOSIS — I10 Essential (primary) hypertension: Secondary | ICD-10-CM | POA: Diagnosis not present

## 2023-08-25 DIAGNOSIS — E782 Mixed hyperlipidemia: Secondary | ICD-10-CM | POA: Diagnosis not present

## 2023-11-24 DIAGNOSIS — I10 Essential (primary) hypertension: Secondary | ICD-10-CM | POA: Diagnosis not present

## 2023-11-24 DIAGNOSIS — E782 Mixed hyperlipidemia: Secondary | ICD-10-CM | POA: Diagnosis not present

## 2023-11-24 DIAGNOSIS — I2510 Atherosclerotic heart disease of native coronary artery without angina pectoris: Secondary | ICD-10-CM | POA: Diagnosis not present

## 2023-12-14 DIAGNOSIS — I951 Orthostatic hypotension: Secondary | ICD-10-CM | POA: Diagnosis not present

## 2023-12-14 DIAGNOSIS — Z5982 Transportation insecurity: Secondary | ICD-10-CM | POA: Diagnosis not present

## 2023-12-14 DIAGNOSIS — Z5948 Other specified lack of adequate food: Secondary | ICD-10-CM | POA: Diagnosis not present

## 2023-12-14 DIAGNOSIS — E785 Hyperlipidemia, unspecified: Secondary | ICD-10-CM | POA: Diagnosis not present

## 2023-12-14 DIAGNOSIS — I10 Essential (primary) hypertension: Secondary | ICD-10-CM | POA: Diagnosis not present

## 2023-12-14 DIAGNOSIS — I2510 Atherosclerotic heart disease of native coronary artery without angina pectoris: Secondary | ICD-10-CM | POA: Diagnosis not present

## 2023-12-14 DIAGNOSIS — Z5941 Food insecurity: Secondary | ICD-10-CM | POA: Diagnosis not present

## 2023-12-14 DIAGNOSIS — Z7982 Long term (current) use of aspirin: Secondary | ICD-10-CM | POA: Diagnosis not present

## 2023-12-14 DIAGNOSIS — R32 Unspecified urinary incontinence: Secondary | ICD-10-CM | POA: Diagnosis not present
